# Patient Record
Sex: Female | Born: 1975 | Race: Black or African American | Hispanic: No | Marital: Married | Smoking: Never smoker
Health system: Southern US, Community
[De-identification: ages and names within clinical notes are randomized; demographics above are authoritative.]

## PROBLEM LIST (undated history)

## (undated) DIAGNOSIS — E7800 Pure hypercholesterolemia, unspecified: Secondary | ICD-10-CM

## (undated) DIAGNOSIS — K219 Gastro-esophageal reflux disease without esophagitis: Secondary | ICD-10-CM

## (undated) DIAGNOSIS — I10 Essential (primary) hypertension: Secondary | ICD-10-CM

## (undated) HISTORY — DX: Pure hypercholesterolemia, unspecified: E78.00

## (undated) HISTORY — DX: Essential (primary) hypertension: I10

## (undated) HISTORY — DX: Gastro-esophageal reflux disease without esophagitis: K21.9

---

## 2001-05-10 HISTORY — PX: OOPHORECTOMY: SHX86

## 2006-06-16 ENCOUNTER — Other Ambulatory Visit: Admission: RE | Admit: 2006-06-16 | Discharge: 2006-06-16 | Payer: Self-pay | Admitting: Obstetrics and Gynecology

## 2007-04-02 ENCOUNTER — Inpatient Hospital Stay (HOSPITAL_COMMUNITY): Admission: AD | Admit: 2007-04-02 | Discharge: 2007-04-04 | Payer: Self-pay | Admitting: Obstetrics and Gynecology

## 2007-07-25 ENCOUNTER — Other Ambulatory Visit: Admission: RE | Admit: 2007-07-25 | Discharge: 2007-07-25 | Payer: Self-pay | Admitting: Obstetrics and Gynecology

## 2008-10-08 ENCOUNTER — Other Ambulatory Visit: Admission: RE | Admit: 2008-10-08 | Discharge: 2008-10-08 | Payer: Self-pay | Admitting: Obstetrics and Gynecology

## 2009-04-07 ENCOUNTER — Emergency Department (HOSPITAL_COMMUNITY): Admission: EM | Admit: 2009-04-07 | Discharge: 2009-04-07 | Payer: Self-pay | Admitting: Emergency Medicine

## 2010-01-28 LAB — HM PAP SMEAR: HM Pap smear: NORMAL

## 2010-02-04 ENCOUNTER — Other Ambulatory Visit: Admission: RE | Admit: 2010-02-04 | Discharge: 2010-02-04 | Payer: Self-pay | Admitting: Family Medicine

## 2010-02-04 ENCOUNTER — Ambulatory Visit: Payer: Self-pay | Admitting: Physician Assistant

## 2010-02-16 ENCOUNTER — Ambulatory Visit: Payer: Self-pay | Admitting: Family Medicine

## 2010-06-05 ENCOUNTER — Ambulatory Visit
Admission: RE | Admit: 2010-06-05 | Discharge: 2010-06-05 | Payer: Self-pay | Source: Home / Self Care | Attending: Family Medicine | Admitting: Family Medicine

## 2010-12-28 ENCOUNTER — Encounter: Payer: Self-pay | Admitting: Family Medicine

## 2010-12-28 ENCOUNTER — Ambulatory Visit (INDEPENDENT_AMBULATORY_CARE_PROVIDER_SITE_OTHER): Payer: Commercial Managed Care - PPO | Admitting: Family Medicine

## 2010-12-28 VITALS — BP 132/84 | HR 72 | Ht 68.0 in | Wt 183.0 lb

## 2010-12-28 DIAGNOSIS — N926 Irregular menstruation, unspecified: Secondary | ICD-10-CM

## 2010-12-28 NOTE — Patient Instructions (Signed)
Continue to keep track of your menstrual cycles on a calendar.  If labs are normal, we will discuss other options at your physical.  If bleeding changes, worsens, or any other concerns, please do not wait until your visit in October

## 2010-12-28 NOTE — Progress Notes (Signed)
Patient presents to discuss menstrual irregularities.  Getting her period every 9-15 days since April.  Most recently has been 12-15 days--this is counting from the last day of her period, to the first day of the next period (so cycles are actually 12-20 days). Periods last 3-5 days, like a normal cycle.  Feels like a typical period--irritability, more emotional, increased eating/hunger.  Weight has been up and down.  Denies any significant fatigue--just the expected tiredness from working and having 2 children.  Denies bowel changes, skin changes, hair loss, mood changes.  Denies any significant stress, nothing new/different over the last few months.  Contraception-- husband had vasectomy.  Last CPE/pap was in October 2011--unfortunately, her chart has been misplaced  Past Medical History  Diagnosis Date  . Pure hypercholesterolemia   . Asthma     childhood    Past Surgical History  Procedure Date  . Oophorectomy 2003    for ovarian cysts--LEFT    History   Social History  . Marital Status: Married    Spouse Name: N/A    Number of Children: N/A  . Years of Education: N/A   Occupational History  . Not on file.   Social History Main Topics  . Smoking status: Never Smoker   . Smokeless tobacco: Never Used  . Alcohol Use: Yes     2 drinks per year.  . Drug Use: No  . Sexually Active: Yes -- Female partner(s)    Birth Control/ Protection: Other-see comments     husband with vasectomy   Other Topics Concern  . Not on file   Social History Narrative  . No narrative on file    Family History  Problem Relation Age of Onset  . Heart disease Mother 13    CABG at 58  . Hyperlipidemia Mother   . Hypertension Mother   . Diabetes Mother   . Arthritis Father   . Asthma Son   . Cancer Neg Hx   . Asthma Son     No current outpatient prescriptions on file.  No Known Allergies  ROS:  See above.  Denies fever, URI symptoms, chest pain, urinary complaint (mild stress  incontinence, occasional), or other concerns  PHYSICAL EXAM: BP 132/84  Pulse 72  Ht 5\' 8"  (1.727 m)  Wt 183 lb (83.008 kg)  BMI 27.82 kg/m2  LMP 12/16/2010 Pleasant, well developed female, in no distress HEENT: PERRL, EOMI, conjunctiva clear.  Whites of eyes visible below iris, but no proptosis Neck: no lymphadenopathy, thyromegaly or mass Heart: regular rate and rhythm, no murmurs Lungs: clear bilaterally Abdomen: soft, nontender, nondistended.  No organomegaly or mass Extremities: no edema Skin: no rash Psych: normal mood, affect, hygiene and grooming  ASSESSMENT/PLAN: 1. Irregular menses  CBC with Differential, TSH   If labs are normal, will continue to keep track of cycles on calendar, and bring to discuss again at her CPE/pap in October.  If starts having changes to her bleeding (ie midcycle spotting, bleeding different than typical period), then may need further evaluation sooner.

## 2010-12-29 ENCOUNTER — Encounter: Payer: Self-pay | Admitting: Family Medicine

## 2010-12-29 LAB — CBC WITH DIFFERENTIAL/PLATELET
Eosinophils Absolute: 0 10*3/uL (ref 0.0–0.7)
Eosinophils Relative: 1 % (ref 0–5)
HCT: 38.9 % (ref 36.0–46.0)
Lymphocytes Relative: 41 % (ref 12–46)
Lymphs Abs: 2 10*3/uL (ref 0.7–4.0)
MCH: 27.7 pg (ref 26.0–34.0)
MCV: 86.3 fL (ref 78.0–100.0)
Monocytes Absolute: 0.4 10*3/uL (ref 0.1–1.0)
RBC: 4.51 MIL/uL (ref 3.87–5.11)
WBC: 4.8 10*3/uL (ref 4.0–10.5)

## 2011-02-08 ENCOUNTER — Other Ambulatory Visit (HOSPITAL_COMMUNITY)
Admission: RE | Admit: 2011-02-08 | Discharge: 2011-02-08 | Disposition: A | Discharge status: Home / Self Care | Payer: Commercial Managed Care - PPO | Source: Ambulatory Visit | Attending: Family Medicine | Admitting: Family Medicine

## 2011-02-08 ENCOUNTER — Ambulatory Visit (INDEPENDENT_AMBULATORY_CARE_PROVIDER_SITE_OTHER): Payer: Commercial Managed Care - PPO | Admitting: Family Medicine

## 2011-02-08 ENCOUNTER — Encounter: Payer: Self-pay | Admitting: Family Medicine

## 2011-02-08 VITALS — BP 112/70 | HR 84 | Ht 68.0 in | Wt 187.0 lb

## 2011-02-08 DIAGNOSIS — Z23 Encounter for immunization: Secondary | ICD-10-CM

## 2011-02-08 DIAGNOSIS — R350 Frequency of micturition: Secondary | ICD-10-CM

## 2011-02-08 DIAGNOSIS — Z20828 Contact with and (suspected) exposure to other viral communicable diseases: Secondary | ICD-10-CM

## 2011-02-08 DIAGNOSIS — Z206 Contact with and (suspected) exposure to human immunodeficiency virus [HIV]: Secondary | ICD-10-CM | POA: Insufficient documentation

## 2011-02-08 DIAGNOSIS — Z0000 Encounter for general adult medical examination without abnormal findings: Secondary | ICD-10-CM

## 2011-02-08 DIAGNOSIS — Z01419 Encounter for gynecological examination (general) (routine) without abnormal findings: Secondary | ICD-10-CM | POA: Insufficient documentation

## 2011-02-08 DIAGNOSIS — E7800 Pure hypercholesterolemia, unspecified: Secondary | ICD-10-CM

## 2011-02-08 LAB — POCT URINALYSIS DIPSTICK
Bilirubin, UA: NEGATIVE
Glucose, UA: NEGATIVE
Ketones, UA: NEGATIVE
Spec Grav, UA: 1.015

## 2011-02-08 LAB — GLUCOSE, RANDOM: Glucose, Bld: 73 mg/dL (ref 70–99)

## 2011-02-08 LAB — LIPID PANEL
Cholesterol: 244 mg/dL — ABNORMAL HIGH (ref 0–200)
HDL: 53 mg/dL (ref >39)
Total CHOL/HDL Ratio: 4.6 Ratio
Triglycerides: 56 mg/dL (ref <150)

## 2011-02-08 LAB — HIV ANTIBODY (ROUTINE TESTING W REFLEX): HIV: NONREACTIVE

## 2011-02-08 NOTE — Progress Notes (Signed)
Urine culture ordered

## 2011-02-08 NOTE — Patient Instructions (Addendum)
HEALTH MAINTENANCE RECOMMENDATIONS:  It is recommended that you get at least 30 minutes of aerobic exercise at least 5 days/week (for weight loss, you may need as much as 60-90 minutes). This can be any activity that gets your heart rate up. This can be divided in 10-15 minute intervals if needed, but try and build up your endurance at least once a week.  Weight bearing exercise is also recommended twice weekly.  Eat a healthy diet with lots of vegetables, fruits and fiber.  "Colorful" foods have a lot of vitamins (ie green vegetables, tomatoes, red peppers, etc).  Limit sweet tea, regular sodas and alcoholic beverages, all of which has a lot of calories and sugar.  Up to 1 alcoholic drink daily may be beneficial for women (unless trying to lose weight, watch sugars).  Drink a lot of water.  Calcium recommendations are 1200-1500 mg daily (1500 mg for postmenopausal women or women without ovaries), and vitamin D 1000 IU daily.  This should be obtained from diet and/or supplements (vitamins), and calcium should not be taken all at once, but in divided doses.  Monthly self breast exams and yearly mammograms for women over the age of 22 is recommended.  Sunscreen of at least SPF 30 should be used on all sun-exposed parts of the skin when outside between the hours of 10 am and 4 pm (not just when at beach or pool, but even with exercise, golf, tennis, and yard work!)  Use a sunscreen that says "broad spectrum" so it covers both UVA and UVB rays, and make sure to reapply every 1-2 hours.  Remember to change the batteries in your smoke detectors when changing your clock times in the spring and fall.  Use your seat belt every time you are in a car, and please drive safely and not be distracted with cell phones and texting while driving.  Urge urinary incontinence--behavioral techniques reviewed--cut back on caffeine, more frequent voiding, not waiting until bladder is full.  Can consider medications in  future if these behavioral techniques are not successful.   Low-Fat, Low-Saturated-Fat, Low-Cholesterol Diets Food Selection Guide BREADS, CEREALS, PASTA, RICE, DRIED PEAS, AND BEANS These products are high in carbohydrates and most are low in fat. Therefore, they can be increased in the diet as substitutes for fatty foods. They too, however, contain calories and should not be eaten in excess. Cereals can be eaten for snacks as well as for breakfast.  Include foods that contain fiber (fruits, vegetables, whole grains, and legumes). Research shows that fiber may lower blood cholesterol levels, especially the water-soluble fiber found in fruits, vegetables, oat products, and legumes. FRUITS AND VEGETABLES It is good to eat fruits and vegetables. Besides being sources of fiber, both are rich in vitamins and some minerals. They help you get the daily allowances of these nutrients. Fruits and vegetables can be used for snacks and desserts. MEATS Limit lean meat, chicken, [place], and fish to no more than 6 ounces per day. Beef, Pork, and Lamb  Use lean cuts of beef, pork, and lamb. Lean cuts include:   Extra-lean ground beef.   Arm roast.   Sirloin tip.   Center-cut ham.   Round steak.   Loin chops.   Rump roast.   Tenderloin.  Trim all fat off the outside of meats before cooking. It is not necessary to severely decrease the intake of red meat, but lean choices should be made. Lean meat is rich in protein and contains a highly absorbable  form of iron. Premenopausal women, in particular, should avoid reducing lean red meat because this could increase the risk for low red blood cells (iron-deficiency anemia). Processed Meats Processed meats, such as bacon, bologna, salami, sausage, and hot dogs contain large quantities of fat, are not rich in valuable nutrients, and should not be eaten very often. Organ Meats The organ meats, such as liver, sweetbreads, kidneys, and brain are very rich in  cholesterol. They should be limited. Chicken and [place] These are good sources of protein. The fat of poultry can be reduced by removing the skin and underlying fat layers before cooking. Chicken and [place] can be substituted for lean red meat in the diet. Poultry should not be fried or covered with high-fat sauces. Fish and Shellfish Fish is a good source of protein. Shellfish contain cholesterol, but they usually are low in saturated fatty acids. The preparation of fish is important. Like chicken and [place], they should not be fried or covered with high-fat sauces. EGGS Egg yolks often are hidden in cooked and processed foods. Egg whites contain no fat or cholesterol. They can be eaten often. Try 1 to 2 egg whites instead of whole eggs in recipes or use egg substitutes that do not contain yolk. MILK AND DAIRY PRODUCTS Use skim or 1% milk instead of 2% or whole milk. Decrease whole milk, natural, and processed cheeses. Use nonfat or low-fat (2%) cottage cheese or low-fat cheeses made from vegetable oils. Choose nonfat or low-fat (1 to 2%) yogurt. Experiment with evaporated skim milk in recipes that call for heavy cream. Substitute low-fat yogurt or low-fat cottage cheese for sour cream in dips and salad dressings. Have at least 2 servings of low-fat dairy products, such as 2 glasses of skim (or 1%) milk each day to help get your daily calcium intake. FATS AND OILS Reduce the total intake of fats, especially saturated fat. Butterfat, lard, and beef fats are high in saturated fat and cholesterol. These should be avoided as much as possible. Vegetable fats do not contain cholesterol, but certain vegetable fats, such as coconut oil, palm oil, and palm kernel oil are very high in saturated fats. These should be limited. These fats are often used in bakery goods, processed foods, popcorn, oils, and nondairy creamers. Vegetable shortenings and some peanut butters contain hydrogenated oils, which are also  saturated fats. Read the labels on these foods and check for saturated vegetable oils. Unsaturated vegetable oils and fats do not raise blood cholesterol. However, they should be limited because they are fats and are high in calories. Total fat should still be limited to 30% of your daily caloric intake. Desirable liquid vegetable oils are corn oil, cottonseed oil, olive oil, canola oil, safflower oil, soybean oil, and sunflower oil. Peanut oil is not as good, but small amounts are acceptable. Buy a heart-healthy tub margarine that has no partially hydrogenated oils in the ingredients. Mayonnaise and salad dressings often are made from unsaturated fats, but they should also be limited because of their high calorie and fat content. Seeds, nuts, peanut butter, olives, and avocados are high in fat, but the fat is mainly the unsaturated type. These foods should be limited mainly to avoid excess calories and fat. OTHER EATING TIPS Snacks  Most sweets should be limited as snacks. They tend to be rich in calories and fats, and their caloric content outweighs their nutritional value. Some good choices in snacks are graham crackers, melba toast, soda crackers, bagels (no egg), English muffins, fruits,  and vegetables. These snacks are preferable to snack crackers, [place] fries, and chips. Popcorn should be air-popped or cooked in small amounts of liquid vegetable oil. Desserts Eat fruit, low-fat yogurt, and fruit ices instead of pastries, cake, and cookies. Sherbet, angel food cake, gelatin dessert, frozen low-fat yogurt, or other frozen products that do not contain saturated fat (pure fruit juice bars, frozen ice pops) are also acceptable.  COOKING METHODS Choose those methods that use little or no fat. They include:  Poaching.   Braising.   Steaming.   Grilling.   Baking.   Stir-frying.   Broiling.   Microwaving.  Foods can be cooked in a nonstick pan without added fat, or use a nonfat cooking  spray in regular cookware. Limit fried foods and avoid frying in saturated fat. Add moisture to lean meats by using water, broth, cooking wines, and other nonfat or low-fat sauces along with the cooking methods mentioned above. Soups and stews should be chilled after cooking. The fat that forms on top after a few hours in the refrigerator should be skimmed off. When preparing meals, avoid using excess salt. Salt can contribute to raising blood pressure in some people. EATING AWAY FROM HOME Order entres, potatoes, and vegetables without sauces or butter. When meat exceeds the size of a deck of cards (3 to 4 ounces), the rest can be taken home for another meal. Choose vegetable or fruit salads and ask for low-calorie salad dressings to be served on the side. Use dressings sparingly. Limit high-fat toppings, such as bacon, crumbled eggs, cheese, sunflower seeds, and olives. Ask for heart-healthy tub margarine instead of butter. Document Released: 10/16/2001 Document Re-Released: 07/21/2009 [facility] Patient Information 2011 [place], [place].

## 2011-02-08 NOTE — Progress Notes (Signed)
Chelsea Gould is a 35 y.o. female who presents for a complete physical.  She has the following concerns: She is here to follow up on her frequent periods.  Since August, periods have been every 21-24 days.  LMP 9/24, and the one prior was 8/30.  F/u hyperlipidemia, requesting bloodwork.  She cut back on fried food, using more olive oil.  Still eating red meats frequently (at least 3 days/week), 2-4 eggs/week, +cheese.  She lost about 30 pounds in the last year.  She would like cholesterol and HIV test.  Sometimes is having a harder time holding her urine, having small amount of leakage.  No leakage with coughing and sneezing. Denies dysuria, hematuria, flank pain, fevers, pelvic pain  There is no immunization history on file for this patient. She cannot recall last tetanus shot (and old chart appears to be lost), believes it was likely more than 5 years ago, or more Last Pap smear: 01/2010 Last mammogram: never Last colonoscopy: never Last DEXA: never Ophtho: 2 years Dentist: yearly Exercise: 30 minutes daily, usually core-work; not getting much cardio  Past Medical History  Diagnosis Date  . Pure hypercholesterolemia   . Asthma     childhood    Past Surgical History  Procedure Date  . Oophorectomy 2003    for ovarian cysts--LEFT    History   Social History  . Marital Status: Married    Spouse Name: N/A    Number of Children: 2  . Years of Education: N/A   Occupational History  . OFFICE MANAGER    Social History Main Topics  . Smoking status: Never Smoker   . Smokeless tobacco: Never Used  . Alcohol Use: Yes     2 drinks per year.  . Drug Use: No  . Sexually Active: Yes -- Female partner(s)    Birth Control/ Protection: Other-see comments     husband with vasectomy; also uses condoms. Husband with HIV   Other Topics Concern  . Not on file   Social History Narrative   Lives with husband, 2 sons.  Husband has HIV, and doing well on meds    Family History    Problem Relation Age of Onset  . Heart disease Mother 73    CABG at 23  . Hyperlipidemia Mother   . Hypertension Mother   . Diabetes Mother   . Arthritis Father   . Asthma Son   . Cancer Neg Hx   . Asthma Son    No current outpatient prescriptions on file.  No Known Allergies  ROS: The patient denies anorexia, fever, weight changes, headaches,  vision changes, decreased hearing, ear pain, sore throat, breast concerns, chest pain, palpitations, dizziness, syncope, dyspnea on exertion, cough, swelling, nausea, vomiting, diarrhea, constipation, abdominal pain, melena, hematochezia, indigestion/heartburn, hematuria, vaginal discharge, odor or itch, genital lesions, joint pains, numbness, tingling, weakness, tremor, suspicious skin lesions, depression, anxiety, abnormal bleeding/bruising, or enlarged lymph nodes.  +L>R breast tenderness since her last period.  Has recently bought a much larger coffee cup (drinking more caffeine)  PHYSICAL EXAM: BP 112/70  Pulse 84  Ht 5\' 8"  (1.727 m)  Wt 187 lb (84.823 kg)  BMI 28.43 kg/m2  LMP 02/01/2011  General Appearance:    Alert, cooperative, no distress, appears stated age  Head:    Normocephalic, without obvious abnormality, atraumatic  Eyes:    PERRL, conjunctiva/corneas clear, EOM's intact, fundi    benign  Ears:    Normal TM's and external ear canals  Nose:   Nares normal, mucosa normal, no drainage or sinus   tenderness  Throat:   Lips, mucosa, and tongue normal; teeth and gums normal  Neck:   Supple, no lymphadenopathy;  thyroid:  no   enlargement/tenderness/nodules; no carotid   bruit or JVD  Back:    Spine nontender, no curvature, ROM normal, no CVA     tenderness  Lungs:     Clear to auscultation bilaterally without wheezes, rales or     ronchi; respirations unlabored  Chest Wall:    No tenderness or deformity   Heart:    Regular rate and rhythm, S1 and S2 normal, no murmur, rub   or gallop  Breast Exam:    No tenderness, masses,  or nipple discharge or inversion.      No axillary lymphadenopathy. Mild tenderness L UOQ with some fibrocystic changes  Abdomen:     Soft, non-tender, nondistended, normoactive bowel sounds,    no masses, no hepatosplenomegaly  Genitalia:    Normal external genitalia without lesions.  BUS and vagina normal; cervix without lesions, or cervical motion tenderness. Many nabothian cysts present. No abnormal vaginal discharge.  Uterus and adnexa not enlarged, nontender, no masses.  Pap performed  Rectal:    Not performed due to age<40 and no related complaints  Extremities:   No clubbing, cyanosis or edema  Pulses:   2+ and symmetric all extremities  Skin:   Skin color, texture, turgor normal, no rashes or lesions  Lymph nodes:   Cervical, supraclavicular, and axillary nodes normal  Neurologic:   CNII-XII intact, normal strength, sensation and gait; reflexes 2+ and symmetric in upper extremities, 1+ and symmetric in lower extremities          Psych:   Normal mood, affect, hygiene and grooming.     ASSESSMENT/PLAN:  1. Routine general medical examination at a health care facility  POCT Urinalysis Dipstick, Visual acuity screening, Glucose, random, Cytology - PAP  2. Pure hypercholesterolemia  Lipid panel  3. Exposure to HIV  HIV Antibody ( Reflex)   husband with HIV  4. Need for Tdap vaccination  Tdap vaccine greater than or equal to 7yo IM  5. Need for influenza vaccination  Flu vaccine greater than or equal to 3yo preservative free IM  6. Urinary frequency  Urine Culture   Frequent periods--stable, without anemia. Continue to keep track of.  Declines hormonal manipulation to regulate cycles.  Urge urinary incontinence--behavioral techniques reviewed--cut back on caffeine, more frequent voiding, not waiting until bladder is full.  Can consider medications in future if these behavioral techniques are not successful.  Briefly reviewed Kegel's exercises also.  Urine sent for culture.  Low  cholesterol diet was reviewed in detail, and handout given.  Discussed monthly self breast exams and yearly mammograms after the age of 31; at least 30 minutes of aerobic activity at least 5 days/week; proper sunscreen use reviewed; healthy diet, including goals of calcium and vitamin D intake and alcohol recommendations (less than or equal to 1 drink/day) reviewed; regular seatbelt use; changing batteries in smoke detectors.  Immunization recommendations discussed--flu and TdaP given today.  Colonoscopy recommendations reviewed--age 45, sooner prn

## 2011-02-09 ENCOUNTER — Encounter: Payer: Self-pay | Admitting: Family Medicine

## 2011-02-10 ENCOUNTER — Other Ambulatory Visit: Payer: Self-pay | Admitting: *Deleted

## 2011-02-10 DIAGNOSIS — E7800 Pure hypercholesterolemia, unspecified: Secondary | ICD-10-CM

## 2011-02-10 LAB — URINE CULTURE: Colony Count: 6000

## 2011-02-16 LAB — CBC
Hemoglobin: 12.6
MCHC: 32.9
MCV: 88.5
Platelets: 215
RDW: 13.9
RDW: 13.9
WBC: 7.8

## 2011-02-16 LAB — RUBELLA SCREEN: Rubella: 15.5 — ABNORMAL HIGH

## 2011-08-02 ENCOUNTER — Other Ambulatory Visit: Payer: Commercial Managed Care - PPO

## 2011-08-11 ENCOUNTER — Encounter: Payer: Self-pay | Admitting: Medical

## 2011-08-11 ENCOUNTER — Ambulatory Visit (INDEPENDENT_AMBULATORY_CARE_PROVIDER_SITE_OTHER): Payer: Commercial Managed Care - PPO | Admitting: Medical

## 2011-08-11 ENCOUNTER — Ambulatory Visit: Payer: Commercial Managed Care - PPO | Admitting: Family Medicine

## 2011-08-11 VITALS — BP 122/80 | HR 80 | Temp 98.3°F | Resp 16 | Wt 195.0 lb

## 2011-08-11 DIAGNOSIS — R300 Dysuria: Secondary | ICD-10-CM

## 2011-08-11 DIAGNOSIS — N898 Other specified noninflammatory disorders of vagina: Secondary | ICD-10-CM

## 2011-08-11 LAB — POCT URINALYSIS DIPSTICK
Nitrite, UA: NEGATIVE
pH, UA: 8

## 2011-08-11 MED ORDER — FLUCONAZOLE 150 MG PO TABS: ORAL_TABLET | ORAL | Status: DC

## 2011-08-11 NOTE — Progress Notes (Signed)
Subjective:  Chelsea Gould is a 36 y.o. female who c/o several day hx/o burning with urination, mild whitish discharge, vaginal odor.  She denies urinary frequency or urgency, no fever, no belly or back pain, no new partners.   She has 1 partner, monogamous and he has HIV, but they use condoms the majority of the time.  She does note using some soap recently that really irritated her vaginal area.    Past Medical History  Diagnosis Date  . Pure hypercholesterolemia   . Asthma     childhood   ROS as noted in HPI  Objective:   Filed Vitals:   08/11/11 1108  BP: 122/80  Pulse: 80  Temp: 98.3 F (36.8 C)  Resp: 16    General appearance: alert, no distress, WD/WN, female Abdomen: +bs, soft, non tender, non distended, no masses, no hepatomegaly, no splenomegaly, no bruits Back: no CVA tenderness Gyn: mild whitish discharge at vaginal opening, no other lesions, otherwise normal external genitalia, cervix with mild erythema, white creamy discharge, swabs taken, no adnexal mass or tenderness, exam chaperoned by nurse      Assessment and Plan:   Encounter Diagnoses  Name Primary?  . Burning with urination Yes  . Vaginal Discharge    Wet prep and KOH prep with yeast, no obvious clue cells, scattered white cell, no trichomonads.  Will send swabs for GC/Chlamydia, urine culture.  Will treat for yeast vaginitis with Diflucan.

## 2011-08-12 LAB — GC/CHLAMYDIA PROBE AMP, GENITAL
Chlamydia, DNA Probe: NEGATIVE
GC Probe Amp, Genital: NEGATIVE

## 2011-10-29 ENCOUNTER — Other Ambulatory Visit: Payer: Self-pay | Admitting: Medical

## 2011-10-29 ENCOUNTER — Ambulatory Visit
Admission: RE | Admit: 2011-10-29 | Discharge: 2011-10-29 | Disposition: A | Discharge status: Home / Self Care | Payer: Commercial Managed Care - PPO | Source: Ambulatory Visit | Attending: Medical | Admitting: Medical

## 2011-10-29 ENCOUNTER — Ambulatory Visit (INDEPENDENT_AMBULATORY_CARE_PROVIDER_SITE_OTHER): Payer: Commercial Managed Care - PPO | Admitting: Medical

## 2011-10-29 ENCOUNTER — Encounter: Payer: Self-pay | Admitting: Medical

## 2011-10-29 VITALS — BP 120/88 | HR 72 | Temp 98.4°F | Resp 16 | Wt 198.0 lb

## 2011-10-29 DIAGNOSIS — R9389 Abnormal findings on diagnostic imaging of other specified body structures: Secondary | ICD-10-CM

## 2011-10-29 DIAGNOSIS — I8390 Asymptomatic varicose veins of unspecified lower extremity: Secondary | ICD-10-CM

## 2011-10-29 NOTE — Progress Notes (Signed)
Subjective:   HPI  Chelsea Gould is a 36 y.o. female who presents with leg c/o.  She was traveling last week, and when she went through the xray machine, the TSA official noticed a spot on her leg.  On the way back through different airport, a different TSA official noticed the same spot on xray.  They didn't tell her what they saw other than a strange xray finding. She is asymptomatic.  No other aggravating or relieving factors.    No other c/o.  The following portions of the patient's history were reviewed and updated as appropriate: allergies, current medications, past family history, past medical history, past social history, past surgical history and problem list.  Past Medical History  Diagnosis Date  . Pure hypercholesterolemia   . Asthma     childhood    No Known Allergies   Review of Systems ROS reviewed and was negative other than noted in HPI or above.    Objective:   Physical Exam  General appearance: alert, no distress, WD/WN Skin: no obvious rash or lesions, no warmth MSK: LE nontneder, there is a small varicosity of the right posterior posteromedial portion of the calve, otherwise no obvious abnormality, normal ROM, nontender, no bony finding. Neurovascularly intact.     Assessment and Plan :     Encounter Diagnoses  Name Primary?  . Abnormal x-ray Yes  . Varicose vein of leg    I suspect calcification within right posterior varicosity of lower leg. No other reason to suspect anything else.  She is asymptomatic and there is no other abnormality on exam.  Will send for xray at her request.  I called and spoke to Dr. Caroline More about this case, and he will be on the lookout for this xray and odd symptom/eval request.

## 2013-05-16 ENCOUNTER — Encounter: Payer: Self-pay | Admitting: Family Medicine

## 2013-05-16 ENCOUNTER — Ambulatory Visit (INDEPENDENT_AMBULATORY_CARE_PROVIDER_SITE_OTHER): Payer: BC Managed Care – PPO | Admitting: Family Medicine

## 2013-05-16 VITALS — BP 118/72 | HR 84 | Ht 68.5 in | Wt 199.0 lb

## 2013-05-16 DIAGNOSIS — L918 Other hypertrophic disorders of the skin: Secondary | ICD-10-CM

## 2013-05-16 DIAGNOSIS — L919 Hypertrophic disorder of the skin, unspecified: Secondary | ICD-10-CM

## 2013-05-16 DIAGNOSIS — L909 Atrophic disorder of skin, unspecified: Secondary | ICD-10-CM

## 2013-05-16 DIAGNOSIS — Z23 Encounter for immunization: Secondary | ICD-10-CM

## 2013-05-16 NOTE — Progress Notes (Signed)
Chief Complaint  Patient presents with  . Nevus    has had a mole right side(bra area) that has recently gotten larger and painful.     She has had this mole under her right breast for many years.  This week it became painful, and larger.  She thinks the bra is irritating it. She has been using a bandage or cotton ball to protect it, and it is currently less painful and smaller in size than it was earlier in the week.  Past Medical History  Diagnosis Date  . Pure hypercholesterolemia   . Asthma     childhood   Past Surgical History  Procedure Laterality Date  . Oophorectomy  2003    for ovarian cysts--LEFT   History   Social History  . Marital Status: Married    Spouse Name: N/A    Number of Children: 2  . Years of Education: N/A   Occupational History  . OFFICE MANAGER    Social History Main Topics  . Smoking status: Never Smoker   . Smokeless tobacco: Never Used  . Alcohol Use: Yes     Comment: 1 drink per month  . Drug Use: No  . Sexual Activity: Yes    Partners: Male    Birth Control/ Protection: Other-see comments     Comment: husband with vasectomy; also uses condoms. Husband with HIV   Other Topics Concern  . Not on file   Social History Narrative   Lives with husband, 2 sons.  Husband has HIV, and doing well on meds   No current outpatient prescriptions on file.  No Known Allergies  ROS:  No bleeding, bruising, other skin lesions.  No fevers, URI symptoms, cough, shortness of breath or wheezing.  PHYSICAL EXAM: BP 118/72  Pulse 84  Ht 5' 8.5" (1.74 m)  Wt 199 lb (90.266 kg)  BMI 29.81 kg/m2  LMP 05/07/2013 Well developed, pleasant female in no distress Under her right breast is a skin tag that is slightly inflamed, and partially ripped off. There is no surrounding erythema, warmth, no drainage  The area was cleansed with alcohol, and an 11 blade was used to remove the rest of the skin tag (less than 1/362mm of attachment left).  Pt tolerated this  minor procedure well, and there was no bleeding at all  ASSESSMENT/PLAN:  Skin tag  Need for prophylactic vaccination and inoculation against influenza - Plan: Flu Vaccine QUAD 36+ mos PF IM (Fluarix)   Flu shot today. F/u at CPE, which she just scheduled for March

## 2013-05-16 NOTE — Patient Instructions (Signed)
You had a skin tag that was inflamed and partly falling off. This was removed today.  Keep bacitracin until on the area until completely healed

## 2013-05-17 DIAGNOSIS — Z23 Encounter for immunization: Secondary | ICD-10-CM

## 2013-07-19 ENCOUNTER — Ambulatory Visit (INDEPENDENT_AMBULATORY_CARE_PROVIDER_SITE_OTHER): Payer: BC Managed Care – PPO | Admitting: Family Medicine

## 2013-07-19 ENCOUNTER — Encounter: Payer: Self-pay | Admitting: Family Medicine

## 2013-07-19 ENCOUNTER — Other Ambulatory Visit (HOSPITAL_COMMUNITY)
Admission: RE | Admit: 2013-07-19 | Discharge: 2013-07-19 | Disposition: A | Discharge status: Home / Self Care | Payer: Commercial Managed Care - PPO | Source: Ambulatory Visit | Attending: Family Medicine | Admitting: Family Medicine

## 2013-07-19 VITALS — BP 122/72 | HR 72 | Ht 68.0 in | Wt 201.0 lb

## 2013-07-19 DIAGNOSIS — Z0000 Encounter for general adult medical examination without abnormal findings: Secondary | ICD-10-CM

## 2013-07-19 DIAGNOSIS — Z1151 Encounter for screening for human papillomavirus (HPV): Secondary | ICD-10-CM | POA: Insufficient documentation

## 2013-07-19 DIAGNOSIS — R5381 Other malaise: Secondary | ICD-10-CM

## 2013-07-19 DIAGNOSIS — Z206 Contact with and (suspected) exposure to human immunodeficiency virus [HIV]: Secondary | ICD-10-CM

## 2013-07-19 DIAGNOSIS — R8290 Unspecified abnormal findings in urine: Secondary | ICD-10-CM

## 2013-07-19 DIAGNOSIS — R5383 Other fatigue: Secondary | ICD-10-CM

## 2013-07-19 DIAGNOSIS — Z20828 Contact with and (suspected) exposure to other viral communicable diseases: Secondary | ICD-10-CM

## 2013-07-19 DIAGNOSIS — Z01419 Encounter for gynecological examination (general) (routine) without abnormal findings: Secondary | ICD-10-CM | POA: Insufficient documentation

## 2013-07-19 DIAGNOSIS — R82998 Other abnormal findings in urine: Secondary | ICD-10-CM

## 2013-07-19 DIAGNOSIS — N926 Irregular menstruation, unspecified: Secondary | ICD-10-CM

## 2013-07-19 DIAGNOSIS — E7800 Pure hypercholesterolemia, unspecified: Secondary | ICD-10-CM

## 2013-07-19 LAB — CBC WITH DIFFERENTIAL/PLATELET
Basophils Absolute: 0.1 10*3/uL (ref 0.0–0.1)
Basophils Relative: 1 % (ref 0–1)
EOS ABS: 0.1 10*3/uL (ref 0.0–0.7)
EOS PCT: 1 % (ref 0–5)
HCT: 36.7 % (ref 36.0–46.0)
HEMOGLOBIN: 12.2 g/dL (ref 12.0–15.0)
LYMPHS ABS: 2.5 10*3/uL (ref 0.7–4.0)
Lymphocytes Relative: 45 % (ref 12–46)
MCH: 27.9 pg (ref 26.0–34.0)
MCHC: 33.2 g/dL (ref 30.0–36.0)
MCV: 83.8 fL (ref 78.0–100.0)
MONO ABS: 0.4 10*3/uL (ref 0.1–1.0)
MONOS PCT: 8 % (ref 3–12)
Neutro Abs: 2.5 10*3/uL (ref 1.7–7.7)
Neutrophils Relative %: 45 % (ref 43–77)
Platelets: 319 10*3/uL (ref 150–400)
RBC: 4.38 MIL/uL (ref 3.87–5.11)
RDW: 13.6 % (ref 11.5–15.5)
WBC: 5.6 10*3/uL (ref 4.0–10.5)

## 2013-07-19 LAB — POCT URINALYSIS DIPSTICK
Bilirubin, UA: NEGATIVE
Blood, UA: NEGATIVE
Glucose, UA: NEGATIVE
Nitrite, UA: NEGATIVE
PH UA: 6
Spec Grav, UA: 1.015
UROBILINOGEN UA: NEGATIVE

## 2013-07-19 LAB — LIPID PANEL
CHOL/HDL RATIO: 4.8 ratio
CHOLESTEROL: 247 mg/dL — AB (ref 0–200)
HDL: 52 mg/dL (ref >39)
LDL Cholesterol: 183 mg/dL — ABNORMAL HIGH (ref 0–99)
Triglycerides: 60 mg/dL (ref <150)
VLDL: 12 mg/dL (ref 0–40)

## 2013-07-19 LAB — COMPREHENSIVE METABOLIC PANEL
ALK PHOS: 64 U/L (ref 39–117)
ALT: 13 U/L (ref 0–35)
AST: 16 U/L (ref 0–37)
Albumin: 4.3 g/dL (ref 3.5–5.2)
BILIRUBIN TOTAL: 0.4 mg/dL (ref 0.2–1.2)
BUN: 11 mg/dL (ref 6–23)
CO2: 24 mEq/L (ref 19–32)
Calcium: 9.3 mg/dL (ref 8.4–10.5)
Chloride: 102 mEq/L (ref 96–112)
Creat: 0.8 mg/dL (ref 0.50–1.10)
GLUCOSE: 75 mg/dL (ref 70–99)
Potassium: 4 mEq/L (ref 3.5–5.3)
SODIUM: 135 meq/L (ref 135–145)
Total Protein: 7.2 g/dL (ref 6.0–8.3)

## 2013-07-19 NOTE — Progress Notes (Signed)
Chief Complaint  Patient presents with  . Annual Exam    fasting annual exam with pap. No concerns. UA showed max leuks, trace protein and 3+ ketones-no symptoms.    Chelsea Gould is a 38 y.o. female who presents for a complete physical.  She has the following concerns:  Hyperlipidemia:  She never returned for recheck of lipids. Last LDL in 2012 was 180.  She eats red meat 2-3 times/week. She snacks on cheese at work, 2-4 eggs/week (just on the weekends, with cheese)  Urge urinary incontinence:  She is no longer waiting as long to go to the bathroom, and isn't having as many problems as at last visit.  She had very slight feeling of burning last week, but not currently having any dysuria, urgency or frequency.  She felt just slightly crampy earlier today, like her cycle might be coming on.  She complains of feeling tired--she thinks she doesn't sleep enough, and has a lot on her plate at all times, +stress.  Periods are every 18-22 days, sometimes are heavy and painful, other times are light and not painful.  Immunization History  Administered Date(s) Administered  . Influenza Split 02/08/2011  . Influenza,inj,Quad PF,36+ Mos 05/17/2013  . Tdap 02/08/2011   Last Pap smear: 02/2011 (no HPV testing done) Last mammogram: never  Last colonoscopy: never  Last DEXA: never  Ophtho: last year (has glasses) Dentist: yearly, but might be behind Exercise: no exercise since June-"busy, lazy"  Past Medical History  Diagnosis Date  . Pure hypercholesterolemia   . Asthma     childhood    Past Surgical History  Procedure Laterality Date  . Oophorectomy  2003    for ovarian cysts--LEFT    History   Social History  . Marital Status: Married    Spouse Name: N/A    Number of Children: 2  . Years of Education: N/A   Occupational History  . OFFICE MANAGER    Social History Main Topics  . Smoking status: Never Smoker   . Smokeless tobacco: Never Used  . Alcohol Use: Yes   Comment: 1 drink per month  . Drug Use: No  . Sexual Activity: Yes    Partners: Male    Birth Control/ Protection: Other-see comments     Comment: husband with vasectomy; also uses condoms (but admits not 100% of the time). Husband with HIV   Other Topics Concern  . Not on file   Social History Narrative   Lives with husband, 2 sons.  Husband has HIV, and doing well on meds (diagnosed in 2011)    Family History  Problem Relation Age of Onset  . Heart disease Mother 9360    CABG at 2061  . Hyperlipidemia Mother   . Hypertension Mother   . Diabetes Mother   . Arthritis Father   . Asthma Son   . Cancer Neg Hx   . Asthma Son   . Depression Sister     No current outpatient prescriptions on file.  No Known Allergies  ROS: The patient denies anorexia, fever, headaches (just occasional), vision changes, decreased hearing, ear pain, sore throat, breast concerns, chest pain, palpitations, dizziness, syncope, dyspnea on exertion, cough, swelling, nausea, vomiting, diarrhea, constipation, abdominal pain, melena, hematochezia, indigestion/heartburn, hematuria, vaginal discharge, odor or itch, genital lesions, joint pains, numbness, tingling, weakness, tremor, suspicious skin lesions, depression, anxiety, abnormal bleeding/bruising, or enlarged lymph nodes. Occasional LBP  +14 pound weight gain since last physical in 02/2011--she relates related to lack  of exercise.  LOTS of eating and celebrations between September and January.  PHYSICAL EXAM: BP 122/72  Pulse 72  Ht 5\' 8"  (1.727 m)  Wt 201 lb (91.173 kg)  BMI 30.57 kg/m2  LMP 07/04/2013  General Appearance:  Alert, cooperative, no distress, appears stated age   Head:  Normocephalic, without obvious abnormality, atraumatic   Eyes:  PERRL, conjunctiva/corneas clear, EOM's intact, fundi  benign   Ears:  Normal TM's and external ear canals   Nose:  Nares normal, mucosa normal, no drainage or sinus tenderness   Throat:  Lips, mucosa,  and tongue normal; teeth and gums normal   Neck:  Supple, no lymphadenopathy; thyroid: no enlargement/tenderness/nodules; no carotid  bruit or JVD   Back:  Spine nontender, no curvature, ROM normal, no CVA tenderness   Lungs:  Clear to auscultation bilaterally without wheezes, rales or ronchi; respirations unlabored   Chest Wall:  No tenderness or deformity   Heart:  Regular rate and rhythm, S1 and S2 normal, no murmur, rub  or gallop   Breast Exam:  No tenderness, masses, or nipple discharge or inversion. No axillary lymphadenopathy. Mild tenderness L UOQ with some fibrocystic changes   Abdomen:  Soft, non-tender, nondistended, normoactive bowel sounds,  no masses, no hepatosplenomegaly   Genitalia:  Normal external genitalia without lesions. BUS and vagina normal; cervix without lesions, or cervical motion tenderness. Many nabothian cysts present. Moderate amount of thin white discharge. Uterus and adnexa not enlarged, nontender, no masses. Pap performed   Rectal:  Not performed due to age<40 and no related complaints   Extremities:  No clubbing, cyanosis or edema   Pulses:  2+ and symmetric all extremities   Skin:  Skin color, texture, turgor normal, no rashes or lesions   Lymph nodes:  Cervical, supraclavicular, and axillary nodes normal   Neurologic:  CNII-XII intact, normal strength, sensation and gait; reflexes 2+ and symmetric in upper extremities, 1+ and symmetric in lower extremities          Psych: Normal mood, affect, hygiene and grooming.   ASSESSMENT/PLAN:  Routine general medical examination at a health care facility - Plan: POCT Urinalysis Dipstick, Visual acuity screening, Lipid panel, Comprehensive metabolic panel, CBC with Differential, Vit D  25 hydroxy (rtn osteoporosis monitoring), TSH, HIV antibody, Cytology - PAP   Irregular menses - Plan: CBC with Differential, TSH  Exposure to HIV - condom use encouraged  - Plan: HIV antibody  Pure hypercholesterolemia  - low cholesterol diet reviewed in detail - Plan: Lipid panel  Abnormal urinalysis - minimal symptoms.  send for culture - Plan: Urine culture  Other malaise and fatigue - Plan: Comprehensive metabolic panel, CBC with Differential, Vit D  25 hydroxy (rtn osteoporosis monitoring), TSH   Discussed monthly self breast exams and yearly mammograms after the age of 20; at least 30 minutes of aerobic activity at least 5 days/week; proper sunscreen use reviewed; healthy diet, including goals of calcium and vitamin D intake and alcohol recommendations (less than or equal to 1 drink/day) reviewed; regular seatbelt use; changing batteries in smoke detectors. Immunization recommendations discussed--UTD. Colonoscopy recommendations reviewed--age 28, sooner prn

## 2013-07-19 NOTE — Patient Instructions (Signed)
HEALTH MAINTENANCE RECOMMENDATIONS:  It is recommended that you get at least 30 minutes of aerobic exercise at least 5 days/week (for weight loss, you may need as much as 60-90 minutes). This can be any activity that gets your heart rate up. This can be divided in 10-15 minute intervals if needed, but try and build up your endurance at least once a week.  Weight bearing exercise is also recommended twice weekly.  Eat a healthy diet with lots of vegetables, fruits and fiber.  "Colorful" foods have a lot of vitamins (ie green vegetables, tomatoes, red peppers, etc).  Limit sweet tea, regular sodas and alcoholic beverages, all of which has a lot of calories and sugar.  Up to 1 alcoholic drink daily may be beneficial for women (unless trying to lose weight, watch sugars).  Drink a lot of water.  Calcium recommendations are 1200-1500 mg daily (1500 mg for postmenopausal women or women without ovaries), and vitamin D 1000 IU daily.  This should be obtained from diet and/or supplements (vitamins), and calcium should not be taken all at once, but in divided doses.  Monthly self breast exams and yearly mammograms for women over the age of 66 is recommended.  Sunscreen of at least SPF 30 should be used on all sun-exposed parts of the skin when outside between the hours of 10 am and 4 pm (not just when at beach or pool, but even with exercise, golf, tennis, and yard work!)  Use a sunscreen that says "broad spectrum" so it covers both UVA and UVB rays, and make sure to reapply every 1-2 hours.  Remember to change the batteries in your smoke detectors when changing your clock times in the spring and fall.  Use your seat belt every time you are in a car, and please drive safely and not be distracted with cell phones and texting while driving.  Fat and Cholesterol Control Diet Fat and cholesterol levels in your blood and organs are influenced by your diet. High levels of fat and cholesterol may lead to diseases  of the heart, small and large blood vessels, gallbladder, liver, and pancreas. CONTROLLING FAT AND CHOLESTEROL WITH DIET Although exercise and lifestyle factors are important, your diet is key. That is because certain foods are known to raise cholesterol and others to lower it. The goal is to balance foods for their effect on cholesterol and more importantly, to replace saturated and trans fat with other types of fat, such as monounsaturated fat, polyunsaturated fat, and omega-3 fatty acids. On average, a person should consume no more than 15 to 17 g of saturated fat daily. Saturated and trans fats are considered "bad" fats, and they will raise LDL cholesterol. Saturated fats are primarily found in animal products such as meats, butter, and cream. However, that does not mean you need to give up all your favorite foods. Today, there are good tasting, low-fat, low-cholesterol substitutes for most of the things you like to eat. Choose low-fat or nonfat alternatives. Choose round or loin cuts of red meat. These types of cuts are lowest in fat and cholesterol. Chicken (without the skin), fish, veal, and ground [place] breast are great choices. Eliminate fatty meats, such as hot dogs and salami. Even shellfish have little or no saturated fat. Have a 3 oz (85 g) portion when you eat lean meat, poultry, or fish. Trans fats are also called "partially hydrogenated oils." They are oils that have been scientifically manipulated so that they are solid at room temperature  resulting in a longer shelf life and improved taste and texture of foods in which they are added. Trans fats are found in stick margarine, some tub margarines, cookies, crackers, and baked goods.  When baking and cooking, oils are a great substitute for butter. The monounsaturated oils are especially beneficial since it is believed they lower LDL and raise HDL. The oils you should avoid entirely are saturated tropical oils, such as coconut and palm.    Remember to eat a lot from food groups that are naturally free of saturated and trans fat, including fish, fruit, vegetables, beans, grains (barley, rice, couscous, bulgur wheat), and pasta (without cream sauces).  IDENTIFYING FOODS THAT LOWER FAT AND CHOLESTEROL  Soluble fiber may lower your cholesterol. This type of fiber is found in fruits such as apples, vegetables such as broccoli, potatoes, and carrots, legumes such as beans, peas, and lentils, and grains such as barley. Foods fortified with plant sterols (phytosterol) may also lower cholesterol. You should eat at least 2 g per day of these foods for a cholesterol lowering effect.  Read package labels to identify low-saturated fats, trans fat free, and low-fat foods at the supermarket. Select cheeses that have only 2 to 3 g saturated fat per ounce. Use a heart-healthy tub margarine that is free of trans fats or partially hydrogenated oil. When buying baked goods (cookies, crackers), avoid partially hydrogenated oils. Breads and muffins should be made from whole grains (whole-wheat or whole oat flour, instead of "flour" or "enriched flour"). Buy non-creamy canned soups with reduced salt and no added fats.  FOOD PREPARATION TECHNIQUES  Never deep-fry. If you must fry, either stir-fry, which uses very little fat, or use non-stick cooking sprays. When possible, broil, bake, or roast meats, and steam vegetables. Instead of putting butter or margarine on vegetables, use lemon and herbs, applesauce, and cinnamon (for squash and sweet potatoes). Use nonfat yogurt, salsa, and low-fat dressings for salads.  LOW-SATURATED FAT / LOW-FAT FOOD SUBSTITUTES Meats / Saturated Fat (g)  Avoid: Steak, marbled (3 oz/85 g) / 11 g  Choose: Steak, lean (3 oz/85 g) / 4 g  Avoid: Hamburger (3 oz/85 g) / 7 g  Choose: Hamburger, lean (3 oz/85 g) / 5 g  Avoid: Ham (3 oz/85 g) / 6 g  Choose: Ham, lean cut (3 oz/85 g) / 2.4 g  Avoid: Chicken, with skin, dark meat (3  oz/85 g) / 4 g  Choose: Chicken, skin removed, dark meat (3 oz/85 g) / 2 g  Avoid: Chicken, with skin, light meat (3 oz/85 g) / 2.5 g  Choose: Chicken, skin removed, light meat (3 oz/85 g) / 1 g Dairy / Saturated Fat (g)  Avoid: Whole milk (1 cup) / 5 g  Choose: Low-fat milk, 2% (1 cup) / 3 g  Choose: Low-fat milk, 1% (1 cup) / 1.5 g  Choose: Skim milk (1 cup) / 0.3 g  Avoid: Hard cheese (1 oz/28 g) / 6 g  Choose: Skim milk cheese (1 oz/28 g) / 2 to 3 g  Avoid: Cottage cheese, 4% fat (1 cup) / 6.5 g  Choose: Low-fat cottage cheese, 1% fat (1 cup) / 1.5 g  Avoid: Ice cream (1 cup) / 9 g  Choose: Sherbet (1 cup) / 2.5 g  Choose: Nonfat frozen yogurt (1 cup) / 0.3 g  Choose: Frozen fruit bar / trace  Avoid: Whipped cream (1 tbs) / 3.5 g  Choose: Nondairy whipped topping (1 tbs) / 1 g Condiments /  Saturated Fat (g)  Avoid: Mayonnaise (1 tbs) / 2 g  Choose: Low-fat mayonnaise (1 tbs) / 1 g  Avoid: Butter (1 tbs) / 7 g  Choose: Extra light margarine (1 tbs) / 1 g  Avoid: Coconut oil (1 tbs) / 11.8 g  Choose: Olive oil (1 tbs) / 1.8 g  Choose: Corn oil (1 tbs) / 1.7 g  Choose: Safflower oil (1 tbs) / 1.2 g  Choose: Sunflower oil (1 tbs) / 1.4 g  Choose: Soybean oil (1 tbs) / 2.4 g  Choose: Canola oil (1 tbs) / 1 g Document Released: 04/26/2005 Document Revised: 08/21/2012 Document Reviewed: 10/15/2010 [employer] Patient Information 2014 [employer] [place].

## 2013-07-20 LAB — TSH: TSH: 0.48 u[IU]/mL (ref 0.350–4.500)

## 2013-07-20 LAB — VITAMIN D 25 HYDROXY (VIT D DEFICIENCY, FRACTURES): Vit D, 25-Hydroxy: 12 ng/mL — ABNORMAL LOW (ref 30–89)

## 2013-07-20 LAB — HIV ANTIBODY (ROUTINE TESTING W REFLEX): HIV: NONREACTIVE

## 2013-07-21 LAB — URINE CULTURE

## 2013-07-23 ENCOUNTER — Other Ambulatory Visit: Payer: Self-pay | Admitting: *Deleted

## 2013-07-23 DIAGNOSIS — E559 Vitamin D deficiency, unspecified: Secondary | ICD-10-CM

## 2013-07-23 MED ORDER — ERGOCALCIFEROL 1.25 MG (50000 UT) PO CAPS: 50000.0000 [IU] | ORAL_CAPSULE | ORAL | Status: DC

## 2013-07-25 ENCOUNTER — Other Ambulatory Visit: Payer: Self-pay | Admitting: *Deleted

## 2013-07-25 DIAGNOSIS — E7800 Pure hypercholesterolemia, unspecified: Secondary | ICD-10-CM

## 2013-07-25 DIAGNOSIS — E559 Vitamin D deficiency, unspecified: Secondary | ICD-10-CM

## 2013-12-27 ENCOUNTER — Telehealth: Payer: Self-pay | Admitting: *Deleted

## 2013-12-27 ENCOUNTER — Other Ambulatory Visit: Payer: BC Managed Care – PPO

## 2013-12-27 DIAGNOSIS — R252 Cramp and spasm: Secondary | ICD-10-CM

## 2013-12-27 DIAGNOSIS — E7800 Pure hypercholesterolemia, unspecified: Secondary | ICD-10-CM

## 2013-12-27 DIAGNOSIS — E559 Vitamin D deficiency, unspecified: Secondary | ICD-10-CM

## 2013-12-27 NOTE — Telephone Encounter (Signed)
Patient was in this morning to have lipid and vitamin D rechecked. She wanted to know if you could add on a potassium level. She has had leg cramping for quite a while now that has worsened over the last month or so.

## 2013-12-27 NOTE — Telephone Encounter (Signed)
Ok for potassium, dx leg cramp

## 2013-12-28 LAB — POTASSIUM: Potassium: 4.2 mEq/L (ref 3.5–5.3)

## 2013-12-28 LAB — LIPID PANEL
CHOL/HDL RATIO: 4.9 ratio
CHOLESTEROL: 251 mg/dL — AB (ref 0–200)
HDL: 51 mg/dL (ref >39)
LDL Cholesterol: 189 mg/dL — ABNORMAL HIGH (ref 0–99)
TRIGLYCERIDES: 57 mg/dL (ref <150)
VLDL: 11 mg/dL (ref 0–40)

## 2013-12-28 LAB — VITAMIN D 25 HYDROXY (VIT D DEFICIENCY, FRACTURES): Vit D, 25-Hydroxy: 21 ng/mL — ABNORMAL LOW (ref 30–89)

## 2013-12-31 ENCOUNTER — Ambulatory Visit (INDEPENDENT_AMBULATORY_CARE_PROVIDER_SITE_OTHER): Payer: BC Managed Care – PPO | Admitting: Family Medicine

## 2013-12-31 ENCOUNTER — Encounter: Payer: Self-pay | Admitting: Family Medicine

## 2013-12-31 VITALS — BP 132/80 | HR 68 | Ht 68.0 in | Wt 188.0 lb

## 2013-12-31 DIAGNOSIS — Z23 Encounter for immunization: Secondary | ICD-10-CM

## 2013-12-31 DIAGNOSIS — E7800 Pure hypercholesterolemia, unspecified: Secondary | ICD-10-CM

## 2013-12-31 DIAGNOSIS — R252 Cramp and spasm: Secondary | ICD-10-CM | POA: Insufficient documentation

## 2013-12-31 DIAGNOSIS — E559 Vitamin D deficiency, unspecified: Secondary | ICD-10-CM | POA: Insufficient documentation

## 2013-12-31 DIAGNOSIS — Z79899 Other long term (current) drug therapy: Secondary | ICD-10-CM

## 2013-12-31 DIAGNOSIS — Z8249 Family history of ischemic heart disease and other diseases of the circulatory system: Secondary | ICD-10-CM | POA: Insufficient documentation

## 2013-12-31 MED ORDER — ROSUVASTATIN CALCIUM 10 MG PO TABS: 10.0000 mg | ORAL_TABLET | Freq: Every day | ORAL | Status: DC

## 2013-12-31 MED ORDER — ERGOCALCIFEROL 1.25 MG (50000 UT) PO CAPS: 50000.0000 [IU] | ORAL_CAPSULE | ORAL | Status: DC

## 2013-12-31 NOTE — Progress Notes (Signed)
Chief Complaint  Patient presents with  . Follow-up    lab follow up.    Vitamin D deficiency--she states pharmacy only gave her 4 capsules, and no refills were on the bottle (it was prescribed for #12). She realizes now she should have called [place] (since she was told to take it for 3 months). She took the 1 month of prescription and has not taken any OTC supplements.  Hyperlipidemia:  Since her last visit, she made significant dietary changes, and has lost 13 pounds.  She cut back on her red meat to once a week, eating more chicken and fish.  She has been eating greens in smoothies.  She uses balsamic or [place] dressing.  She has been using egg whites since her last visit.  She continues to eat cheese (didn't cut back).  +ice cream over the last 2 months.  Rare alfredo sauces.   She is complaining of her legs and toes cramping, and sometimes her hands will also cramp.  Cramping is worse when she stretches. She reports drinking a lot of water.  She had asked to have potassium added to her bloodwork, due to these cramps.  Past Medical History  Diagnosis Date  . Pure hypercholesterolemia   . Asthma     childhood   Past Surgical History  Procedure Laterality Date  . Oophorectomy  2003    for ovarian cysts--LEFT   History   Social History  . Marital Status: Married    Spouse Name: N/A    Number of Children: 2  . Years of Education: N/A   Occupational History  . OFFICE MANAGER    Social History Main Topics  . Smoking status: Never Smoker   . Smokeless tobacco: Never Used  . Alcohol Use: Yes     Comment: 1 drink per month  . Drug Use: No  . Sexual Activity: Yes    Partners: Male    Birth Control/ Protection: Other-see comments     Comment: husband with vasectomy; also uses condoms (but admits not 100% of the time). Husband with HIV   Other Topics Concern  . Not on file   Social History Narrative   Lives with husband, 2 sons.  Husband has HIV, and doing well on meds (diagnosed  in 2011)   Family History  Problem Relation Age of Onset  . Heart disease Mother 87    CABG at 8  . Hyperlipidemia Mother   . Hypertension Mother   . Diabetes Mother   . Arthritis Father   . Asthma Son   . Cancer Neg Hx   . Asthma Son   . Depression Sister    No current outpatient prescriptions on file prior to visit.   No current facility-administered medications on file prior to visit.   No Known Allergies  ROS:  +intentional weight loss.  No headaches, dizziness, fatigue. No chest pain, palpitations, shortness of breath, GI complaints. +muscle cramps periodically as per HPI. No myalgias or joint pains.  1 month ago, out of the blue on her left nipple she had a small blister.  It was very painful.  Resolved after a few days, hasn't recurred.  PHYSICAL EXAM: BP 132/80  Pulse 68  Ht  (1.727 m)  Wt 188 lb (85.276 kg)  BMI 28.59 kg/m2  LMP 12/16/2013  Well developed, pleasant female in no distress. Remainder of visit was limited to review of labs and counseling.   Lab Results  Component Value Date   CHOL  251* 12/27/2013   HDL 51 12/27/2013   LDLCALC 189* 12/27/2013   TRIG 57 12/27/2013   CHOLHDL 4.9 12/27/2013   Vitamin D-OH 21 (up from 12) K+ = 4.2  ASSESSMENT/PLAN:  Pure hypercholesterolemia - no improvement despite significant dietary changes.  Likely has hereditary component.  Start Crestor. Risks reviewed. - Plan: rosuvastatin (CRESTOR) 10 MG tablet  Unspecified vitamin D deficiency - 12 weeks rx, followed by longterm OTC supplementation - Plan: ergocalciferol (VITAMIN D2) 50000 UNITS capsule  Cramp of both lower extremities - counseled re: possible causes, treatments  Family history of heart disease in female family member before age 91   Vit D deficiency--rx x 12 months; discussed importance of OTC afterwards.  Recheck at CPE in 6 months  Hyperlipidemia: Crestor  #2 weeks of samples were given, and rx for #90 with $3 copay card.  Coenzyme Q10 if  needed for side effects.  Call if not tolerating med  Risks/side effects were reviewed.  Husband had vasectomy.  Flu shot given  CPE 6 months 2-3 months LFT's and lipids (lab visit only)  (Vit D to be rechecked at CPE)

## 2013-12-31 NOTE — Patient Instructions (Addendum)
Leg Cramps Leg cramps that occur during exercise can be caused by poor circulation or dehydration. However, muscle cramps that occur at rest or during the night are usually not due to any serious medical problem. Heat cramps may cause muscle spasms during hot weather.  CAUSES There is no clear cause for muscle cramps. However, dehydration may be a factor for those who do not drink enough fluids and those who exercise in the heat. Imbalances in the level of sodium, potassium, calcium or magnesium in the muscle tissue may also be a factor. Some medications, such as water pills (diuretics), may cause loss of chemicals that the body needs (like sodium and potassium) and cause muscle cramps. TREATMENT   Make sure your diet has enough fluids and essential minerals for the muscle to work normally.  Avoid strenuous exercise for several days if you have been having frequent leg cramps.  Stretch and massage the cramped muscle for several minutes.  Some medicines may be helpful in some patients with night cramps. Only take over-the-counter or prescription medicines as directed by your caregiver. SEEK IMMEDIATE MEDICAL CARE IF:   Your leg cramps become worse.  Your foot becomes cold, numb, or blue. Document Released: 06/03/2004 Document Revised: 07/19/2011 Document Reviewed: 05/21/2008 [facility] Patient Information 2015 [place], [place]. This information is not intended to replace advice given to you by your health care provider. Make sure you discuss any questions you have with your health care provider.   Look into the [facility] article on using the white bar of soap for leg cramps. You can also consider drinking some tonic water at bedtime (has quinine).  After you complete the 12 weeks of prescription vitamin D, please make sure to get an over-the-counter supplement that you will need to take long-term.  I recommend at least 1000 (up to 2000) IU every day.  This can be in a separate vitamin D,  vs in a multi-vitamin (usually the "women's" multivitamin has at least [phone] IU of vitamin D--please look at the label.  Continue low cholesterol diet.  Start the Crestor once daily.  If you develop any muscle aches, start taking Coenzyme Q10 along with it.  If the muscle pain is severe, if there is any associated weakness, please contact us.

## 2014-03-05 ENCOUNTER — Other Ambulatory Visit: Payer: BC Managed Care – PPO

## 2014-03-05 DIAGNOSIS — Z79899 Other long term (current) drug therapy: Secondary | ICD-10-CM

## 2014-03-05 DIAGNOSIS — E7800 Pure hypercholesterolemia, unspecified: Secondary | ICD-10-CM

## 2014-03-05 LAB — HEPATIC FUNCTION PANEL
ALT: 12 U/L (ref 0–35)
AST: 15 U/L (ref 0–37)
Albumin: 4.1 g/dL (ref 3.5–5.2)
Alkaline Phosphatase: 58 U/L (ref 39–117)
BILIRUBIN DIRECT: 0.1 mg/dL (ref 0.0–0.3)
BILIRUBIN TOTAL: 0.4 mg/dL (ref 0.2–1.2)
Indirect Bilirubin: 0.3 mg/dL (ref 0.2–1.2)
Total Protein: 7 g/dL (ref 6.0–8.3)

## 2014-03-05 LAB — LIPID PANEL
CHOL/HDL RATIO: 3 ratio
CHOLESTEROL: 152 mg/dL (ref 0–200)
HDL: 51 mg/dL (ref >39)
LDL Cholesterol: 90 mg/dL (ref 0–99)
Triglycerides: 56 mg/dL (ref <150)
VLDL: 11 mg/dL (ref 0–40)

## 2014-03-11 ENCOUNTER — Encounter: Payer: Self-pay | Admitting: Family Medicine

## 2014-05-14 ENCOUNTER — Encounter: Payer: Self-pay | Admitting: Family Medicine

## 2014-05-14 DIAGNOSIS — E7800 Pure hypercholesterolemia, unspecified: Secondary | ICD-10-CM

## 2014-05-14 MED ORDER — ROSUVASTATIN CALCIUM 10 MG PO TABS: 10.0000 mg | ORAL_TABLET | Freq: Every day | ORAL | Status: DC

## 2014-05-14 NOTE — Telephone Encounter (Signed)
Refill sent.  Has CPE in March.  Needs OV sooner to discuss irregular cycles and birth control pills (pt wrote MyChart message requesting appt to be scheduled after 1/8)

## 2014-05-20 ENCOUNTER — Encounter: Payer: Self-pay | Admitting: Family Medicine

## 2014-05-20 ENCOUNTER — Ambulatory Visit (INDEPENDENT_AMBULATORY_CARE_PROVIDER_SITE_OTHER): Payer: BLUE CROSS/BLUE SHIELD | Admitting: Family Medicine

## 2014-05-20 VITALS — BP 140/94 | HR 76 | Ht 68.0 in | Wt 201.0 lb

## 2014-05-20 DIAGNOSIS — N926 Irregular menstruation, unspecified: Secondary | ICD-10-CM

## 2014-05-20 DIAGNOSIS — Z206 Contact with and (suspected) exposure to human immunodeficiency virus [HIV]: Secondary | ICD-10-CM

## 2014-05-20 DIAGNOSIS — R030 Elevated blood-pressure reading, without diagnosis of hypertension: Secondary | ICD-10-CM

## 2014-05-20 DIAGNOSIS — IMO0001 Reserved for inherently not codable concepts without codable children: Secondary | ICD-10-CM

## 2014-05-20 LAB — TSH: TSH: 1.009 u[IU]/mL (ref 0.350–4.500)

## 2014-05-20 MED ORDER — NORGESTIM-ETH ESTRAD TRIPHASIC 0.18/0.215/0.25 MG-25 MCG PO TABS: 1.0000 | ORAL_TABLET | Freq: Every day | ORAL | Status: DC

## 2014-05-20 NOTE — Patient Instructions (Addendum)
Your blood pressure was elevated today. Please try and check it elsewhere to ensure that it isn't running high often.  Normal is <135/85. If consistently >140/90 then treatment is recommended.  Limiting the sodium intake and getting daily exercise helps keep the blood pressure down.  See below for low sodium diet.   . Start birth control pills the Sunday after your next period BEGINS.  Stop it if you develop migraines, or any other side effects.  You do need to try and take them about the same time every day.  It might take 2-3 months to completely regulate the cycle--expect some possible bleeding during the pack initially.  If you are having irregular bleeding, please keep track of it on your calendar (along with where you are in the pill pack at the time).  Birth control pills can raise the blood pressure, which is already up today, so please try and keep track of it (and ensure that it isn't really high consistently prior to starting).  Consider Truvada for post-exposure prophylaxis--this means to prevent contracting HIV from your husband, since you aren't reliably using condoms currently.  Low-Sodium Eating Plan Sodium raises blood pressure and causes water to be held in the body. Getting less sodium from food will help lower your blood pressure, reduce any swelling, and protect your heart, liver, and kidneys. We get sodium by adding salt (sodium chloride) to food. Most of our sodium comes from canned, boxed, and frozen foods. Restaurant foods, fast foods, and pizza are also very high in sodium. Even if you take medicine to lower your blood pressure or to reduce fluid in your body, getting less sodium from your food is important. WHAT IS MY PLAN? Most people should limit their sodium intake to 2,300 mg a day. Your health care provider recommends that you limit your sodium intake to __________ a day.  WHAT DO I NEED TO KNOW ABOUT THIS EATING PLAN? For the low-sodium eating plan, you will follow these  general guidelines:  Choose foods with a % Daily Value for sodium of less than 5% (as listed on the food label).   Use salt-free seasonings or herbs instead of table salt or sea salt.   Check with your health care provider or pharmacist before using salt substitutes.   Eat fresh foods.  Eat more vegetables and fruits.  Limit canned vegetables. If you do use them, rinse them well to decrease the sodium.   Limit cheese to 1 oz (28 g) per day.   Eat lower-sodium products, often labeled as "lower sodium" or "no salt added."  Avoid foods that contain monosodium glutamate (MSG). MSG is sometimes added to [place] food and some canned foods.  Check food labels (Nutrition Facts labels) on foods to learn how much sodium is in one serving.  Eat more home-cooked food and less restaurant, buffet, and fast food.  When eating at a restaurant, ask that your food be prepared with less salt or none, if possible.  HOW DO I READ FOOD LABELS FOR SODIUM INFORMATION? The Nutrition Facts label lists the amount of sodium in one serving of the food. If you eat more than one serving, you must multiply the listed amount of sodium by the number of servings. Food labels may also identify foods as:  Sodium free--Less than 5 mg in a serving.  Very low sodium--35 mg or less in a serving.  Low sodium--140 mg or less in a serving.  Light in sodium--50% less sodium in a serving.  For example, if a food that usually has 300 mg of sodium is changed to become light in sodium, it will have 150 mg of sodium.  Reduced sodium--25% less sodium in a serving. For example, if a food that usually has 400 mg of sodium is changed to reduced sodium, it will have 300 mg of sodium. WHAT FOODS CAN I EAT? Grains Low-sodium cereals, including oats, puffed wheat and rice, and shredded wheat cereals. Low-sodium crackers. Unsalted rice and pasta. Lower-sodium bread.  Vegetables Frozen or fresh vegetables. Low-sodium or  reduced-sodium canned vegetables. Low-sodium or reduced-sodium tomato sauce and paste. Low-sodium or reduced-sodium tomato and vegetable juices.  Fruits Fresh, frozen, and canned fruit. Fruit juice.  Meat and Other Protein Products Low-sodium canned tuna and salmon. Fresh or frozen meat, poultry, seafood, and fish. Lamb. Unsalted nuts. Dried beans, peas, and lentils without added salt. Unsalted canned beans. Homemade soups without salt. Eggs.  Dairy Milk. Soy milk. Ricotta cheese. Low-sodium or reduced-sodium cheeses. Yogurt.  Condiments Fresh and dried herbs and spices. Salt-free seasonings. Onion and garlic powders. Low-sodium varieties of mustard and ketchup. Lemon juice.  Fats and Oils Reduced-sodium salad dressings. Unsalted butter.  Other Unsalted popcorn and pretzels.  The items listed above may not be a complete list of recommended foods or beverages. Contact your dietitian for more options. WHAT FOODS ARE NOT RECOMMENDED? Grains Instant hot cereals. Bread stuffing, pancake, and biscuit mixes. Croutons. Seasoned rice or pasta mixes. Noodle soup cups. Boxed or frozen macaroni and cheese. Self-rising flour. Regular salted crackers. Vegetables Regular canned vegetables. Regular canned tomato sauce and paste. Regular tomato and vegetable juices. Frozen vegetables in sauces. Salted french fries. Olives. Rosita Fire. Relishes. Sauerkraut. Salsa. Meat and Other Protein Products Salted, canned, smoked, spiced, or pickled meats, seafood, or fish. Bacon, ham, sausage, hot dogs, corned beef, chipped beef, and packaged luncheon meats. Salt pork. Jerky. Pickled herring. Anchovies, regular canned tuna, and sardines. Salted nuts. Dairy Processed cheese and cheese spreads. Cheese curds. Blue cheese and cottage cheese. Buttermilk.  Condiments Onion and garlic salt, seasoned salt, table salt, and sea salt. Canned and packaged gravies. Worcestershire sauce. Tartar sauce. Barbecue sauce.  Teriyaki sauce. Soy sauce, including reduced sodium. Steak sauce. Fish sauce. Oyster sauce. Cocktail sauce. Horseradish. Regular ketchup and mustard. Meat flavorings and tenderizers. Bouillon cubes. Hot sauce. Tabasco sauce. Marinades. Taco seasonings. Relishes. Fats and Oils Regular salad dressings. Salted butter. Margarine. Ghee. Bacon fat.  Other Potato and tortilla chips. Corn chips and puffs. Salted popcorn and pretzels. Canned or dried soups. Pizza. Frozen entrees and pot pies.  The items listed above may not be a complete list of foods and beverages to avoid. Contact your dietitian for more information. Document Released: 10/16/2001 Document Revised: 05/01/2013 Document Reviewed: 02/28/2013 [facility] Patient Information 2015 [place], [place]. This information is not intended to replace advice given to you by your health care provider. Make sure you discuss any questions you have with your health care provider.   Emtricitabine; Tenofovir tablets What is this medicine? EMTRICITABINE; TENOFOVIR, PMPA (em tri SIT uh bean; te NOE fo veer) is two antiretroviral medicines in one tablet. It is used with other medicines to treat HIV and with safe sex practices to prevent HIV in high risk persons. This medicine is not a cure for HIV. This medicine may be used for other purposes; ask your health care provider or pharmacist if you have questions. COMMON BRAND NAME(S): Truvada What should I tell my health care provider before I take this medicine? They  need to know if you have any of these conditions: -bone problems -kidney disease -liver disease -an unusual or allergic reaction to emtricitabine, tenofovir, other medicines, foods, dyes, or preservatives -pregnant or trying to get pregnant -breast-feeding How should I use this medicine? Take this medicine by mouth with a glass of water. Follow the directions on the prescription label. You can take it with or without food. If it upsets your  stomach, take it with food. Take your medicine at regular intervals. Do not take your medicine more often than directed. For your anti-HIV therapy to work as well as possible, take each dose exactly as prescribed. Do not skip doses or stop your medicine even if you feel better. Skipping doses may make the HIV virus resistant to this medicine and other medicines. Do not stop taking except on your doctor's advice. Talk to your pediatrician regarding the use of this medicine in children. Special care may be needed. Overdosage: If you think you have taken too much of this medicine contact a poison control center or emergency room at once. NOTE: This medicine is only for you. Do not share this medicine with others. What if I miss a dose? If you miss a dose, take it as soon as you can. If it is almost time for your next dose, take only that dose. Do not take double or extra doses. What may interact with this medicine? Do not take this medicine with any of the following medications: -adefovir -any medicine that contains lamivudine -any medicine that contains emtricitabine or tenofovir This medicine may also interact with the following medications: -atazanavir -didanosine, ddI -lopinavir; ritonavir -medicines for viral infections like cidofovir, acyclovir, valacyclovir, ganciclovir, valganciclovir -saquinavir This list may not describe all possible interactions. Give your health care provider a list of all the medicines, herbs, non-prescription drugs, or dietary supplements you use. Also tell them if you smoke, drink alcohol, or use illegal drugs. Some items may interact with your medicine. What should I watch for while using this medicine? Visit your doctor or health care professional for regular check ups. Discuss any new symptoms with your doctor. You will need to have important blood work done while on this medicine. HIV is spread to others through sexual or blood contact. Talk to your doctor about  how to stop the spread of HIV. If you have hepatitis B, talk to your doctor if you plan to stop this medicine. The symptoms of hepatitis B may get worse if you stop this medicine. What side effects may I notice from receiving this medicine? Side effects that you should report to your doctor or health care professional as soon as possible: -allergic reactions like skin rash, itching or hives, swelling of the face, lips, or tongue -breathing difficulties -dark urine -general ill feeling or flu-like symptoms -light-colored stools -loss of appetite -nausea, vomiting, unusual stomach upset or pain -right upper belly pain -trouble passing urine or change in the amount of urine -unusually weak or tired -yellowing of the eyes or skin Side effects that usually do not require medical attention (report to your doctor or health care professional if they continue or are bothersome): -diarrhea -dizziness -headache -skin discoloration on the hands or feet -weight gain around waist, back, or thinning of face, arms, legs This list may not describe all possible side effects. Call your doctor for medical advice about side effects. You may report side effects to FDA at 1-800-FDA-1088. Where should I keep my medicine? Keep out of the reach of  children. Store at room temperature between 15 and 30 degrees C (59 and 86 degrees F). Throw away any unused medicine after the expiration date. NOTE: This sheet is a summary. It may not cover all possible information. If you have questions about this medicine, talk to your doctor, pharmacist, or health care provider.  2015, Elsevier/Gold Standard. (2010-11-23 18:05:15)

## 2014-05-20 NOTE — Progress Notes (Signed)
Chief Complaint  Patient presents with  . Advice Only    desires to discuss birth control options.    "My periods are out of whack".  Between October 31 and November 30 she had 3 cycles, seems too frequent. Since then she also had periods on 12/18 (or close) and 1/5. Seems to be cycling every 3 weeks, although there was an extra one in November; she was under stress that month. She wants to discuss taking birth control pills to regulate her cycles.  She took them in the past, after each child (20years ago, and 6-7 years ago).  The first time she took it, she took them for 10 years without problems. More recently (after second child), she didn't tolerate the pills--she would miss pills sometimes, and had spotting.  Denies having had migraines, significant nausea, blood clots or other side effects. Didn't like all the spotting she had, so stopped after taking it just 1-2 months.  Shortly after that her husband had the vasectomy.  Her husband has HIV, doing well.  She admits they have only been using condoms about 25% of the time. Denies vaginal discharge, odor, itch.  She currently takes Crestor daily, sometimes forgets--plans to change to evening dosing to make it easier to remember  PMH, PSH, SH reviewed and updated. Outpatient Encounter Prescriptions as of 05/20/2014  Medication Sig  . rosuvastatin (CRESTOR) 10 MG tablet Take 1 tablet (10 mg total) by mouth daily.  . Norgestimate-Ethinyl Estradiol Triphasic (ORTHO TRI-CYCLEN LO) 0.18/0.215/0.25 MG-25 MCG tab Take 1 tablet by mouth daily.  . [DISCONTINUED] ergocalciferol (VITAMIN D2) 50000 UNITS capsule Take 1 capsule (50,000 Units total) by mouth once a week.   (OCPs started today)  No Known Allergies  ROS: no fevers, chills, hair/skin/bowel/energy/weight/mood changes.  No URI symptoms, chest pain, shortness of breath, headaches, dizziness, edema or other concerns.    PHYSICAL EXAM: BP 140/94 mmHg  Pulse 76  Ht 5\' 8"  (1.727 m)  Wt 201  lb (91.173 kg)  BMI 30.57 kg/m2  LMP 05/14/2014  146/94 on repeat by MD Pleasant female in no distress Neck: no lymphadenopathy, thyromegaly or mass Heart: regular rate and rhythm Lungs: clear bilaterally Abdomen: soft, nontender, no mass Extremities: no edema normal pulse Psych: normal mood, affect, hygiene and grooming  ASSESSMENT/PLAN:  Irregular menses - trial of OCP's to regulate. Reviewed risks/side effects, how to take - Plan: TSH, Norgestimate-Ethinyl Estradiol Triphasic (ORTHO TRI-CYCLEN LO) 0.18/0.215/0.25 MG-25 MCG tab  Exposure to HIV - not very compliant with condoms, at risk of contracting HIV.  To consider Truvada prophylaxis. Check HIV today - Plan: HIV antibody  Elevated blood pressure - previously normal.  reviewed low sodium diet, monitor elsewhere.  OCP's may further increase BP, so monitoring is important.   Elevated BP today, normal in past TSH, HIV  Discussed Truvada--to consider F/u as scheduled for CPE in March

## 2014-05-21 LAB — HIV ANTIBODY (ROUTINE TESTING W REFLEX): HIV 1&2 Ab, 4th Generation: NONREACTIVE

## 2014-05-28 ENCOUNTER — Encounter: Payer: Self-pay | Admitting: Family Medicine

## 2014-05-29 ENCOUNTER — Telehealth: Payer: Self-pay | Admitting: Internal Medicine

## 2014-05-29 DIAGNOSIS — E7800 Pure hypercholesterolemia, unspecified: Secondary | ICD-10-CM

## 2014-05-29 MED ORDER — ROSUVASTATIN CALCIUM 10 MG PO TABS: 10.0000 mg | ORAL_TABLET | Freq: Every day | ORAL | Status: DC

## 2014-05-29 NOTE — Telephone Encounter (Signed)
Pt needs a refill on crestor 10mg  [facility]

## 2014-05-29 NOTE — Telephone Encounter (Signed)
Done

## 2014-07-24 ENCOUNTER — Encounter: Payer: Self-pay | Admitting: Family Medicine

## 2014-07-24 ENCOUNTER — Ambulatory Visit (INDEPENDENT_AMBULATORY_CARE_PROVIDER_SITE_OTHER): Payer: BLUE CROSS/BLUE SHIELD | Admitting: Family Medicine

## 2014-07-24 VITALS — BP 120/84 | HR 80 | Ht 68.0 in | Wt 191.2 lb

## 2014-07-24 DIAGNOSIS — N920 Excessive and frequent menstruation with regular cycle: Secondary | ICD-10-CM

## 2014-07-24 DIAGNOSIS — E7800 Pure hypercholesterolemia, unspecified: Secondary | ICD-10-CM

## 2014-07-24 DIAGNOSIS — Z0000 Encounter for general adult medical examination without abnormal findings: Secondary | ICD-10-CM

## 2014-07-24 DIAGNOSIS — Z206 Contact with and (suspected) exposure to human immunodeficiency virus [HIV]: Secondary | ICD-10-CM | POA: Diagnosis not present

## 2014-07-24 DIAGNOSIS — E559 Vitamin D deficiency, unspecified: Secondary | ICD-10-CM | POA: Diagnosis not present

## 2014-07-24 LAB — POCT URINALYSIS DIPSTICK
Bilirubin, UA: NEGATIVE
Glucose, UA: NEGATIVE
Leukocytes, UA: NEGATIVE
Nitrite, UA: NEGATIVE
PH UA: 6
PROTEIN UA: NEGATIVE
RBC UA: NEGATIVE
SPEC GRAV UA: 1.025
Urobilinogen, UA: NEGATIVE

## 2014-07-24 LAB — LIPID PANEL
CHOL/HDL RATIO: 3.3 ratio
Cholesterol: 122 mg/dL (ref 0–200)
HDL: 37 mg/dL — ABNORMAL LOW (ref >=46)
LDL CALC: 73 mg/dL (ref 0–99)
Triglycerides: 59 mg/dL (ref <150)
VLDL: 12 mg/dL (ref 0–40)

## 2014-07-24 LAB — CBC WITH DIFFERENTIAL/PLATELET
Basophils Absolute: 0 10*3/uL (ref 0.0–0.1)
Basophils Relative: 1 % (ref 0–1)
EOS ABS: 0 10*3/uL (ref 0.0–0.7)
Eosinophils Relative: 1 % (ref 0–5)
HCT: 37.4 % (ref 36.0–46.0)
Hemoglobin: 12.2 g/dL (ref 12.0–15.0)
LYMPHS ABS: 1.9 10*3/uL (ref 0.7–4.0)
LYMPHS PCT: 49 % — AB (ref 12–46)
MCH: 27.5 pg (ref 26.0–34.0)
MCHC: 32.6 g/dL (ref 30.0–36.0)
MCV: 84.2 fL (ref 78.0–100.0)
MONOS PCT: 8 % (ref 3–12)
MPV: 10.5 fL (ref 8.6–12.4)
Monocytes Absolute: 0.3 10*3/uL (ref 0.1–1.0)
Neutro Abs: 1.6 10*3/uL — ABNORMAL LOW (ref 1.7–7.7)
Neutrophils Relative %: 41 % — ABNORMAL LOW (ref 43–77)
PLATELETS: 283 10*3/uL (ref 150–400)
RBC: 4.44 MIL/uL (ref 3.87–5.11)
RDW: 13.7 % (ref 11.5–15.5)
WBC: 3.8 10*3/uL — AB (ref 4.0–10.5)

## 2014-07-24 LAB — COMPREHENSIVE METABOLIC PANEL
ALT: 11 U/L (ref 0–35)
AST: 19 U/L (ref 0–37)
Albumin: 4.3 g/dL (ref 3.5–5.2)
Alkaline Phosphatase: 58 U/L (ref 39–117)
BILIRUBIN TOTAL: 0.5 mg/dL (ref 0.2–1.2)
BUN: 11 mg/dL (ref 6–23)
CO2: 23 mEq/L (ref 19–32)
Calcium: 9.2 mg/dL (ref 8.4–10.5)
Chloride: 104 mEq/L (ref 96–112)
Creat: 0.88 mg/dL (ref 0.50–1.10)
Glucose, Bld: 78 mg/dL (ref 70–99)
Potassium: 4.4 mEq/L (ref 3.5–5.3)
SODIUM: 138 meq/L (ref 135–145)
Total Protein: 7.2 g/dL (ref 6.0–8.3)

## 2014-07-24 NOTE — Progress Notes (Signed)
Chief Complaint  Patient presents with  . Annual Exam    fasting annual exam with pap. No concerns.   Chelsea Gould is a 39 y.o. female who presents for a complete physical.  She has the following concerns:  Hyperlipidemia:  Crestor was started in August.  She changed to evening dosing 2 months ago, and has been easier to remember. She denies side effects.  She is following a lowfat, low cholesterol diet.  Irregular menses, as discussed at her January visit.  She reports the day of her last visit was "a really bad day"--stress related to work, had seen something related to her sister-in-law that had passed away that made her very emotional, etc.  She never ended up filling the birth control pills.  Periods are back to every 18-21 days, which has been her usual pattern.  LMP 2/24, and she no longer is concerned about it, no longer irregular, and not interested in starting the pills.  Vitamin D deficiency: she has been compliant with taking a supplement daily.  Urge urinary incontinence: much better--she goes more frequently, rather than waiting for long time.  Immunization History  Administered Date(s) Administered  . Influenza Split 02/08/2011  . Influenza,inj,Quad PF,36+ Mos 05/17/2013, 12/31/2013  . Tdap 02/08/2011   Last Pap smear: 07/2013; negative, with no high risk HPV Last mammogram: never  Last colonoscopy: never  Last DEXA: never  Ophtho:2014 (has glasses) Dentist: previously went yearly, but needs to find new one. Last was 3 years ago Exercise: elliptical once a week (maybe)  HIV test negative 05/2014 Normal TSH 05/2014  Past Medical History  Diagnosis Date  . Pure hypercholesterolemia   . Asthma     childhood    Past Surgical History  Procedure Laterality Date  . Oophorectomy  2003    for ovarian cysts--LEFT    History   Social History  . Marital Status: Married    Spouse Name: N/A  . Number of Children: 2  . Years of Education: N/A   Occupational  History  . OFFICE MANAGER    Social History Main Topics  . Smoking status: Never Smoker   . Smokeless tobacco: Never Used  . Alcohol Use: Yes     Comment: 1 drink per month  . Drug Use: No  . Sexual Activity:    Partners: Male    Birth Control/ Protection: Other-see comments     Comment: husband with vasectomy; also uses condoms (but admits not 100% of the time). Husband with HIV   Other Topics Concern  . Not on file   Social History Narrative   Lives with husband, 2 sons.  Husband has HIV, and doing well on meds (diagnosed in 2011)    Family History  Problem Relation Age of Onset  . Heart disease Mother 81    CABG at 37  . Hyperlipidemia Mother   . Hypertension Mother   . Diabetes Mother   . Arthritis Father   . Asthma Son   . Cancer Neg Hx   . Asthma Son   . Depression Sister   . Diabetes Maternal Aunt   . Diabetes Maternal Uncle     Outpatient Encounter Prescriptions as of 07/24/2014  Medication Sig Note  . cholecalciferol (VITAMIN D) 1000 UNITS tablet Take 1,000 Units by mouth daily.   . rosuvastatin (CRESTOR) 10 MG tablet Take 1 tablet (10 mg total) by mouth daily.   . [DISCONTINUED] Norgestimate-Ethinyl Estradiol Triphasic (ORTHO TRI-CYCLEN LO) 0.18/0.215/0.25 MG-25 MCG tab Take  1 tablet by mouth daily. (Patient not taking: Reported on 07/24/2014) 07/24/2014: Never started    No Known Allergies  ROS: The patient denies anorexia, fever, headaches (just occasional), vision changes, decreased hearing, ear pain, sore throat, breast concerns, chest pain, palpitations, dizziness, syncope, dyspnea on exertion, cough, swelling, nausea, vomiting, diarrhea, constipation, abdominal pain, melena, hematochezia, indigestion/heartburn, hematuria, vaginal discharge, odor or itch, genital lesions, joint pains, numbness, tingling, weakness, tremor, suspicious skin lesions, depression, anxiety, abnormal bleeding/bruising, or enlarged lymph nodes. Occasional LBP (not  frequent) Intentional 10# weight loss in the last 2 months (went on a diet) Seasonal allergies started recently, using Zyrtec prn. Periods are every 18-21 days Nausea when she eats broccoli, spinach, green beans   PHYSICAL EXAM:  BP 120/84 mmHg  Pulse 80  Ht '5\' 8"'  (1.727 m)  Wt 191 lb 3.2 oz (86.728 kg)  BMI 29.08 kg/m2  LMP 07/03/2014  General Appearance:  Alert, cooperative, no distress, appears stated age   Head:  Normocephalic, without obvious abnormality, atraumatic   Eyes:  PERRL, conjunctiva/corneas clear, EOM's intact, fundi  benign   Ears:  Normal TM's and external ear canals   Nose:  Nares normal, mucosa normal, no drainage or sinus tenderness   Throat:  Lips, mucosa, and tongue normal; teeth and gums normal   Neck:  Supple, no lymphadenopathy; thyroid: no enlargement/tenderness/nodules; no carotid  bruit or JVD   Back:  Spine nontender, no curvature, ROM normal, no CVA tenderness   Lungs:  Clear to auscultation bilaterally without wheezes, rales or ronchi; respirations unlabored   Chest Wall:  No tenderness or deformity   Heart:  Regular rate and rhythm, S1 and S2 normal, no murmur, rub  or gallop   Breast Exam:  No tenderness, masses, or nipple discharge or inversion. No axillary lymphadenopathy. Mild tenderness L UOQ with some fibrocystic changes   Abdomen:  Soft, non-tender, nondistended, normoactive bowel sounds,  no masses, no hepatosplenomegaly   Genitalia:  Normal external genitalia without lesions. BUS and vagina normal; no cervical motion tenderness. Many nabothian cysts present.  Uterus and adnexa not enlarged, nontender, no masses. Pap not performed   Rectal:  Normal sphincter tone; no masses; heme negative brown stool  Extremities:  No clubbing, cyanosis or edema   Pulses:  2+ and symmetric all extremities   Skin:  Skin color, texture, turgor normal, no rashes or lesions   Lymph nodes:  Cervical, supraclavicular, and  axillary nodes normal   Neurologic:  CNII-XII intact, normal strength, sensation and gait; reflexes 2+ and symmetric in upper extremities, 1+ and symmetric in lower extremities    Psych:  Normal mood, affect, hygiene and grooming       Urine dip:  2+ ketones (she is fasting), otherwise normal.   ASSESSMENT/PLAN:  Annual physical exam - Plan: POCT Urinalysis Dipstick, Visual acuity screening, Lipid panel, Comprehensive metabolic panel, CBC with Differential/Platelet  Exposure to HIV - regular condom use encouraged to prevent transmission from husband.  consider PreP  Pure hypercholesterolemia - Plan: Lipid panel, Comprehensive metabolic panel  Vitamin D deficiency - Plan: Vit D  25 hydroxy (rtn osteoporosis monitoring)  Unusually frequent menses - back to her usual regular pattern of q18-21 days; declines OCP's - Plan: CBC with Differential/Platelet  Discussed monthly self breast exams and yearly mammograms after the age of 37; at least 30 minutes of aerobic activity at least 5 days/week; proper sunscreen use reviewed; healthy diet, including goals of calcium and vitamin D intake and alcohol recommendations (less than  or equal to 1 drink/day) reviewed; regular seatbelt use; changing batteries in smoke detectors. Immunization recommendations discussed--UTD. Colonoscopy recommendations reviewed--age 71, sooner prn    c-met, lipid, Vit D, CBC  [facility]--[facility] recommended Schedule dentist appointment

## 2014-07-24 NOTE — Patient Instructions (Signed)
  HEALTH MAINTENANCE RECOMMENDATIONS:  It is recommended that you get at least 30 minutes of aerobic exercise at least 5 days/week (for weight loss, you may need as much as 60-90 minutes). This can be any activity that gets your heart rate up. This can be divided in 10-15 minute intervals if needed, but try and build up your endurance at least once a week.  Weight bearing exercise is also recommended twice weekly.  Eat a healthy diet with lots of vegetables, fruits and fiber.  "Colorful" foods have a lot of vitamins (ie green vegetables, tomatoes, red peppers, etc).  Limit sweet tea, regular sodas and alcoholic beverages, all of which has a lot of calories and sugar.  Up to 1 alcoholic drink daily may be beneficial for women (unless trying to lose weight, watch sugars).  Drink a lot of water.  Calcium recommendations are 1200-1500 mg daily (1500 mg for postmenopausal women or women without ovaries), and vitamin D 1000 IU daily.  This should be obtained from diet and/or supplements (vitamins), and calcium should not be taken all at once, but in divided doses.  Monthly self breast exams and yearly mammograms for women over the age of 41 is recommended.  Sunscreen of at least SPF 30 should be used on all sun-exposed parts of the skin when outside between the hours of 10 am and 4 pm (not just when at beach or pool, but even with exercise, golf, tennis, and yard work!)  Use a sunscreen that says "broad spectrum" so it covers both UVA and UVB rays, and make sure to reapply every 1-2 hours.  Remember to change the batteries in your smoke detectors when changing your clock times in the spring and fall.  Use your seat belt every time you are in a car, and please drive safely and not be distracted with cell phones and texting while driving.   [facility]--I recommend the 3D mammogram Schedule dentist appointment  Please use condoms regularly! Consider PreP (pre-exposure  prophylaxis) with Truvada if not using condoms--discuss with your husband's doctor.

## 2014-07-25 ENCOUNTER — Encounter: Payer: Self-pay | Admitting: Family Medicine

## 2014-07-25 LAB — VITAMIN D 25 HYDROXY (VIT D DEFICIENCY, FRACTURES): VIT D 25 HYDROXY: 37 ng/mL (ref 30–100)

## 2014-09-23 ENCOUNTER — Other Ambulatory Visit: Payer: Self-pay | Admitting: *Deleted

## 2014-09-23 ENCOUNTER — Encounter: Payer: Self-pay | Admitting: Family Medicine

## 2014-09-23 DIAGNOSIS — E7800 Pure hypercholesterolemia, unspecified: Secondary | ICD-10-CM

## 2014-09-23 MED ORDER — ROSUVASTATIN CALCIUM 10 MG PO TABS: 10.0000 mg | ORAL_TABLET | Freq: Every day | ORAL | Status: DC

## 2015-06-19 ENCOUNTER — Encounter: Payer: Self-pay | Admitting: Family Medicine

## 2015-06-19 ENCOUNTER — Ambulatory Visit (INDEPENDENT_AMBULATORY_CARE_PROVIDER_SITE_OTHER): Payer: Managed Care, Other (non HMO) | Admitting: Family Medicine

## 2015-06-19 VITALS — BP 130/76 | HR 76 | Ht 68.0 in | Wt 209.0 lb

## 2015-06-19 DIAGNOSIS — M25531 Pain in right wrist: Secondary | ICD-10-CM | POA: Diagnosis not present

## 2015-06-19 MED ORDER — NAPROXEN 500 MG PO TABS: 500.0000 mg | ORAL_TABLET | Freq: Two times a day (BID) | ORAL | Status: DC

## 2015-06-19 NOTE — Patient Instructions (Signed)
  Take the naproxen twice daily with food, regularly until your symptoms completely resolve.  I suspect 1 week will be enough, but giving you enough medication for 2. Use the wrist brace while doing activities and working (not at night). Return in 2 weeks if not better. Try and avoid overusing it and doing activities which cause pain.    Take your prescribed anti-inflammatory medication with food; discontinue or cut back the dose if you develop stomach pain/discomfort/side effects.  Do not take other over-the-counter pain medications such as ibuprofen, advil, motrin, aleve, naproxen at the same time.  Do not use longer than recommended.  It is okay to use acetaminophen (tylenol) along with this medication.

## 2015-06-19 NOTE — Progress Notes (Signed)
Chief Complaint  Patient presents with  . Arm Pain    right arm pain for several years but has worsened and become ore frequent over the last 2 months.   . Flu Vaccine    declined.    Right wrist and forearm pain started 2 days ago--not as bad yesterday or today.  She used a wrist brace which seemed to help 2 days ago. When she put it on yesterday (after it was already feeling a little better), the brace seemed to cause a slight discomfort.  She has soreness in the palm and anterior arm, some discomfort in the forearm with typing and moving her fingers.  Some problems off/on for years, more discomfort over the last couple of months, then worse this week.  She took 4 ibuprofen on Tuesday, didn't help, so that's when she got the brace, which did help. She hasn't taken any further pain medication.  Denies numbness, tingling or weakness. Hurts to lift heavy things, sometimes feel like her grip won't hold, so switches hands.  Right handed, types a lot at work.  PMH, PSH, SH reviewed.  Outpatient Encounter Prescriptions as of 06/19/2015  Medication Sig  . cholecalciferol (VITAMIN D) 1000 UNITS tablet Take 1,000 Units by mouth daily.  . rosuvastatin (CRESTOR) 10 MG tablet Take 1 tablet (10 mg total) by mouth daily.   No facility-administered encounter medications on file as of 06/19/2015.   Evening primrose oil prn  No Known Allergies  ROS: no fever, chills, URI symptoms, chest pain, GI complaints, numbness, tingling, weakness, other joint pains, edema, bleeding, bruising, rash.  PHYSICAL EXAM: BP 130/76 mmHg  Pulse 76  Ht  (1.727 m)  Wt 209 lb (94.802 kg)  BMI 31.79 kg/m2  LMP 05/26/2015  Well developed, pleasant female in no distress RUE:  Area of discomfort--Medial aspect of the thenar eminence, into the central palmar area and the anterior wrist. Normal strength, no wasting/atrophy. Negative Phalen and Tinel.  Slight discomfort in this area (lower palm/wrist) on Phalen's  testing, but no other symptoms. Normal strength, sensation. Mild discomfort over the tendons of the thumb with thumb movements. Negative Finklestein test.  ASSESSMENT/PLAN:  Right wrist pain - suspect tendonitis vs CTS - Plan: naproxen (NAPROSYN) 500 MG tablet   Take the naproxen twice daily with food, regularly until your symptoms completely resolve.  I suspect 1 week will be enough, but giving you enough medication for 2. Use the wrist brace while doing activities and working (not at night). Return in 2 weeks if not better. Try and avoid overusing it and doing activities which cause pain.

## 2015-07-30 ENCOUNTER — Encounter: Payer: Self-pay | Admitting: Family Medicine

## 2015-07-30 ENCOUNTER — Ambulatory Visit (INDEPENDENT_AMBULATORY_CARE_PROVIDER_SITE_OTHER): Payer: Managed Care, Other (non HMO) | Admitting: Family Medicine

## 2015-07-30 VITALS — BP 128/72 | HR 80 | Ht 68.5 in | Wt 211.6 lb

## 2015-07-30 DIAGNOSIS — E559 Vitamin D deficiency, unspecified: Secondary | ICD-10-CM | POA: Diagnosis not present

## 2015-07-30 DIAGNOSIS — Z206 Contact with and (suspected) exposure to human immunodeficiency virus [HIV]: Secondary | ICD-10-CM

## 2015-07-30 DIAGNOSIS — N920 Excessive and frequent menstruation with regular cycle: Secondary | ICD-10-CM

## 2015-07-30 DIAGNOSIS — R635 Abnormal weight gain: Secondary | ICD-10-CM

## 2015-07-30 DIAGNOSIS — E7800 Pure hypercholesterolemia, unspecified: Secondary | ICD-10-CM

## 2015-07-30 DIAGNOSIS — Z0000 Encounter for general adult medical examination without abnormal findings: Secondary | ICD-10-CM

## 2015-07-30 LAB — CBC WITH DIFFERENTIAL/PLATELET
Basophils Absolute: 0 10*3/uL (ref 0.0–0.1)
Basophils Relative: 1 % (ref 0–1)
Eosinophils Absolute: 0.1 10*3/uL (ref 0.0–0.7)
Eosinophils Relative: 2 % (ref 0–5)
HEMATOCRIT: 37.3 % (ref 36.0–46.0)
Hemoglobin: 11.9 g/dL — ABNORMAL LOW (ref 12.0–15.0)
LYMPHS PCT: 42 % (ref 12–46)
Lymphs Abs: 1.9 10*3/uL (ref 0.7–4.0)
MCH: 26.9 pg (ref 26.0–34.0)
MCHC: 31.9 g/dL (ref 30.0–36.0)
MCV: 84.4 fL (ref 78.0–100.0)
MONO ABS: 0.3 10*3/uL (ref 0.1–1.0)
MONOS PCT: 7 % (ref 3–12)
MPV: 10.9 fL (ref 8.6–12.4)
NEUTROS ABS: 2.2 10*3/uL (ref 1.7–7.7)
Neutrophils Relative %: 48 % (ref 43–77)
Platelets: 323 10*3/uL (ref 150–400)
RBC: 4.42 MIL/uL (ref 3.87–5.11)
RDW: 13.9 % (ref 11.5–15.5)
WBC: 4.5 10*3/uL (ref 4.0–10.5)

## 2015-07-30 LAB — COMPREHENSIVE METABOLIC PANEL WITH GFR
ALT: 15 U/L (ref 6–29)
AST: 18 U/L (ref 10–30)
Albumin: 4.2 g/dL (ref 3.6–5.1)
Alkaline Phosphatase: 65 U/L (ref 33–115)
BUN: 14 mg/dL (ref 7–25)
CO2: 25 mmol/L (ref 20–31)
Calcium: 9.3 mg/dL (ref 8.6–10.2)
Chloride: 103 mmol/L (ref 98–110)
Creat: 0.88 mg/dL (ref 0.50–1.10)
Glucose, Bld: 94 mg/dL (ref 65–99)
Potassium: 4.5 mmol/L (ref 3.5–5.3)
Sodium: 137 mmol/L (ref 135–146)
Total Bilirubin: 0.4 mg/dL (ref 0.2–1.2)
Total Protein: 7.2 g/dL (ref 6.1–8.1)

## 2015-07-30 LAB — LIPID PANEL
Cholesterol: 191 mg/dL (ref 125–200)
HDL: 47 mg/dL
LDL Cholesterol: 130 mg/dL — ABNORMAL HIGH
Total CHOL/HDL Ratio: 4.1 ratio
Triglycerides: 69 mg/dL
VLDL: 14 mg/dL

## 2015-07-30 LAB — TSH: TSH: 1.21 m[IU]/L

## 2015-07-30 MED ORDER — ROSUVASTATIN CALCIUM 10 MG PO TABS: 10.0000 mg | ORAL_TABLET | Freq: Every day | ORAL | Status: DC

## 2015-07-30 NOTE — Patient Instructions (Signed)
  HEALTH MAINTENANCE RECOMMENDATIONS:  It is recommended that you get at least 30 minutes of aerobic exercise at least 5 days/week (for weight loss, you may need as much as 60-90 minutes). This can be any activity that gets your heart rate up. This can be divided in 10-15 minute intervals if needed, but try and build up your endurance at least once a week.  Weight bearing exercise is also recommended twice weekly.  Eat a healthy diet with lots of vegetables, fruits and fiber.  "Colorful" foods have a lot of vitamins (ie green vegetables, tomatoes, red peppers, etc).  Limit sweet tea, regular sodas and alcoholic beverages, all of which has a lot of calories and sugar.  Up to 1 alcoholic drink daily may be beneficial for women (unless trying to lose weight, watch sugars).  Drink a lot of water.  Calcium recommendations are 1200-1500 mg daily (1500 mg for postmenopausal women or women without ovaries), and vitamin D 1000 IU daily.  This should be obtained from diet and/or supplements (vitamins), and calcium should not be taken all at once, but in divided doses.  Monthly self breast exams and yearly mammograms for women over the age of 42 is recommended.  Sunscreen of at least SPF 30 should be used on all sun-exposed parts of the skin when outside between the hours of 10 am and 4 pm (not just when at beach or pool, but even with exercise, golf, tennis, and yard work!)  Use a sunscreen that says "broad spectrum" so it covers both UVA and UVB rays, and make sure to reapply every 1-2 hours.  Remember to change the batteries in your smoke detectors when changing your clock times in the spring and fall.  Use your seat belt every time you are in a car, and please drive safely and not be distracted with cell phones and texting while driving.    Please schedule a routine dental appointment. Please call the [facility] and schedule a routine screening mammogram. Add in weight-bearing exercise as  discussed. Take your vitamin D 1000 IU every night--okay to take the same time as your Crestor (morning or evening--whichever routine is easiest for you to remember. If you forget a dose of the vitamin D, it is okay to double up the next day (for this dose). We will be in contact with your test results--if it is VERY low, we might temporarily have you use a higher dose.

## 2015-07-30 NOTE — Progress Notes (Signed)
Chief Complaint  Patient presents with  . Annual Exam    fasting annual exam with pap, is on last day of cycle. No concerns.   Chelsea Gould is a 40 y.o. female who presents for a complete physical.  She has the following concerns:  Seen recently with right wrist pain, told possible tendonitis vs CTS. Treated with naproxen and a wrist brace. She took the naproxen for a week, along with using the brace, and pain completely resolved.  She was laid off from her job 3 weeks ago (so not typing, using arm as much).  Hyperlipidemia: on Crestor, takes each evening, and denies side effects. Admits that since losing her job a few weeks ago, she has been more sporadic, only remembering to take it about twice a week, got off a regular schedule.  Prior to that was very consistent in taking it daily. She is following a lowfat, low cholesterol diet.  Menses are mostly regular, more frequent than others, but typical for her.  Not particularly heavy, sometimes has 2/month, but this month was later than usual.  Vitamin D deficiency: she has been compliant with taking a supplement daily.    Immunization History  Administered Date(s) Administered  . Influenza Split 02/08/2011  . Influenza,inj,Quad PF,36+ Mos 05/17/2013, 12/31/2013  . Tdap 02/08/2011  didn't get a flu shot this year, declines Last Pap smear: 07/2013; negative, with no high risk HPV Last mammogram: never  Last colonoscopy: never  Last DEXA: never  Ophtho:2015 (has glasses) Dentist: 4 years ago, had gone regularly prior--plans to get a new dentist Exercise: walking on treadmill, going to the gym (bike, ab machine), walking outside.  Exercising daily since losing her job; prior to this was 1-2x/week.  HIV test negative 05/2014 Normal TSH 05/2014  Past Medical History  Diagnosis Date  . Pure hypercholesterolemia   . Asthma     childhood    Past Surgical History  Procedure Laterality Date  . Oophorectomy  2003    for ovarian  cysts--LEFT    Social History   Social History  . Marital Status: Married    Spouse Name: N/A  . Number of Children: 2  . Years of Education: N/A   Occupational History  . OFFICE MANAGER    Social History Main Topics  . Smoking status: Never Smoker   . Smokeless tobacco: Never Used  . Alcohol Use: 0.0 oz/week    0 Standard drinks or equivalent per week     Comment: 1 drink per month  . Drug Use: No  . Sexual Activity:    Partners: Male    Birth Control/ Protection: Other-see comments     Comment: husband with vasectomy; also uses condoms. Husband with HIV   Other Topics Concern  . Not on file   Social History Narrative   Lives with husband, 2 sons. Oldest lives in [place]. 4 stepchildren--stepdaughter is also living with them. Husband has HIV, and doing well on meds (diagnosed in 2011). She was laid off from work 07/2015 (when [employer] changed to [employer])    Family History  Problem Relation Age of Onset  . Heart disease Mother 59    CABG at 60  . Hyperlipidemia Mother   . Hypertension Mother   . Diabetes Mother   . Arthritis Father   . Asthma Son   . Cancer Neg Hx   . Asthma Son   . Depression Sister   . Hypertension Sister   . Diabetes Maternal Aunt   .  Diabetes Maternal Uncle     Outpatient Encounter Prescriptions as of 07/30/2015  Medication Sig Note  . Evening Primrose Oil 1000 MG CAPS Take by mouth.   . Ginkgo Biloba (GNP GINGKO BILOBA EXTRACT PO) Take 1 capsule by mouth daily.   . rosuvastatin (CRESTOR) 10 MG tablet Take 1 tablet (10 mg total) by mouth daily. 07/30/2015: Missing a lot frequently in the last few weeks (only taking 2x/wk; had been regular prior to this)  . [DISCONTINUED] Evening Primrose topical oil Apply 1 capsule topically daily.   . cholecalciferol (VITAMIN D) 1000 UNITS tablet Take 1,000 Units by mouth daily. Reported on 07/30/2015 07/30/2015: Takes very sporadically (2-3 times/month)  . [DISCONTINUED] naproxen (NAPROSYN) 500 MG  tablet Take 1 tablet (500 mg total) by mouth 2 (two) times daily with a meal.    No facility-administered encounter medications on file as of 07/30/2015.    No Known Allergies  ROS: The patient denies anorexia, fever, headaches (just occasional), vision changes, decreased hearing, ear pain, sore throat, breast concerns, chest pain, palpitations, dizziness, syncope, dyspnea on exertion, cough, swelling, nausea, vomiting, diarrhea, constipation, abdominal pain, melena, hematochezia, indigestion/heartburn, hematuria, vaginal discharge, odor or itch, genital lesions, joint pains, numbness, tingling, weakness, tremor, suspicious skin lesions, depression, anxiety, abnormal bleeding/bruising, or enlarged lymph nodes. Occasional LBP (less often than in the past, infrequent). Seasonal allergies started recently, using Zyrtec prn. Frequent menses, unchanged +10# weight gain in the last year (had lost this weight prior to last phyiscal, regained).  PHYSICAL EXAM:  BP 128/72 mmHg  Pulse 80  Ht 5' 8.5" (1.74 m)  Wt 211 lb 9.6 oz (95.981 kg)  BMI 31.70 kg/m2  LMP 07/26/2015  General Appearance:  Alert, cooperative, no distress, appears stated age   Head:  Normocephalic, without obvious abnormality, atraumatic   Eyes:  PERRL, conjunctiva/corneas clear, EOM's intact, fundi  benign   Ears:  Normal TM's and external ear canals   Nose:  Nares normal, mucosa normal, no drainage or sinus tenderness   Throat:  Lips, mucosa, and tongue normal; teeth and gums normal   Neck:  Supple, no lymphadenopathy; thyroid: no enlargement/tenderness/nodules; no carotid  bruit or JVD   Back:  Spine nontender, no curvature, ROM normal, no CVA tenderness   Lungs:  Clear to auscultation bilaterally without wheezes, rales or ronchi; respirations unlabored   Chest Wall:  No tenderness or deformity   Heart:  Regular rate and rhythm, S1 and S2 normal, no murmur, rub  or gallop    Breast Exam:  No tenderness, masses, or nipple discharge or inversion. No axillary lymphadenopathy. Mild tenderness L UOQ with some fibrocystic changes   Abdomen:  Soft, non-tender, nondistended, normoactive bowel sounds,  no masses, no hepatosplenomegaly   Genitalia:  Normal external genitalia without lesions. BUS and vagina normal; no cervical motion tenderness.  Uterus and adnexa not enlarged, nontender, no masses. Pap not performed   Rectal:  Normal sphincter tone; no masses; heme negative brown stool  Extremities:  No clubbing, cyanosis or edema   Pulses:  2+ and symmetric all extremities   Skin:  Skin color, texture, turgor normal, no rashes or lesions   Lymph nodes:  Cervical, supraclavicular, and axillary nodes normal   Neurologic:  CNII-XII intact, normal strength, sensation and gait; reflexes 2+ and symmetric in upper extremities, 1+ and symmetric in lower extremities    Psych: Normal mood, affect, hygiene and grooming              ASSESSMENT/PLAN:  Annual  physical exam - Plan: Visual acuity screening, rosuvastatin (CRESTOR) 10 MG tablet, Lipid panel, Comprehensive metabolic panel, CBC with Differential/Platelet, VITAMIN D 25 Hydroxy (Vit-D Deficiency, Fractures), TSH, HIV antibody  Pure hypercholesterolemia - Plan: rosuvastatin (CRESTOR) 10 MG tablet, Lipid panel, Comprehensive metabolic panel  Vitamin D deficiency - Plan: VITAMIN D 25 Hydroxy (Vit-D Deficiency, Fractures)  Weight gain - Plan: TSH  Unusually frequent menses - Plan: CBC with Differential/Platelet  HIV exposure - Plan: HIV antibody   c-met, lipid, vit D, CBC, HIV, TSH  Discussed monthly self breast exams and yearly mammograms after the age of 52; at least 30 minutes of aerobic activity at least 5 days/week, add weight bearing exercise 2-3 times/week; proper sunscreen use reviewed; healthy diet, including goals of calcium and vitamin D intake  and alcohol recommendations (less than or equal to 1 drink/day) reviewed; regular seatbelt use; changing batteries in smoke detectors. Immunization recommendations discussed--declines flu shot today, recommended yearly. Colonoscopy recommendations reviewed--age 61, sooner prn    Please schedule a routine dental appointment. [facility] call the [facility] and schedule a routine screening mammogram. Add in weight-bearing exercise as discussed. Take your vitamin D 1000 IU every night--okay to take the same time as your Crestor (morning or evening--whichever routine is easiest for you to remember. If you forget a dose of the vitamin D, it is okay to double up the next day (for this dose). We will be in contact with your test results--if it is VERY low, we might temporarily have you use a higher dose.

## 2015-07-31 LAB — VITAMIN D 25 HYDROXY (VIT D DEFICIENCY, FRACTURES): Vit D, 25-Hydroxy: 23 ng/mL — ABNORMAL LOW (ref 30–100)

## 2015-07-31 LAB — HIV ANTIBODY (ROUTINE TESTING W REFLEX): HIV 1&2 Ab, 4th Generation: NONREACTIVE

## 2015-07-31 MED ORDER — VITAMIN D (ERGOCALCIFEROL) 1.25 MG (50000 UNIT) PO CAPS: 50000.0000 [IU] | ORAL_CAPSULE | ORAL | Status: DC

## 2015-07-31 NOTE — Addendum Note (Signed)
Addended by: Joselyn ArrowKNAPP, Shantai Tiedeman on: 07/31/2015 08:39 AM   Modules accepted: Orders

## 2016-01-08 ENCOUNTER — Encounter: Payer: Self-pay | Admitting: Family Medicine

## 2016-02-05 ENCOUNTER — Telehealth: Payer: Self-pay | Admitting: Family Medicine

## 2016-02-05 DIAGNOSIS — Z0000 Encounter for general adult medical examination without abnormal findings: Secondary | ICD-10-CM

## 2016-02-05 DIAGNOSIS — E7800 Pure hypercholesterolemia, unspecified: Secondary | ICD-10-CM

## 2016-02-05 MED ORDER — ROSUVASTATIN CALCIUM 10 MG PO TABS
10.0000 mg | ORAL_TABLET | Freq: Every day | ORAL | 1 refills | Status: DC
Start: 2016-02-05 — End: 2016-02-13

## 2016-02-05 NOTE — Telephone Encounter (Signed)
Pt come in and needs a refill on  rosuvastatin, pt uses [facility] [place] - [address] pt can be reached at [phone]

## 2016-02-05 NOTE — Telephone Encounter (Signed)
Done

## 2016-02-13 ENCOUNTER — Encounter: Payer: Self-pay | Admitting: Family Medicine

## 2016-02-13 DIAGNOSIS — E7800 Pure hypercholesterolemia, unspecified: Secondary | ICD-10-CM

## 2016-02-13 DIAGNOSIS — Z0000 Encounter for general adult medical examination without abnormal findings: Secondary | ICD-10-CM

## 2016-02-13 MED ORDER — ROSUVASTATIN CALCIUM 10 MG PO TABS: 10.0000 mg | ORAL_TABLET | Freq: Every day | ORAL | 0 refills | Status: DC

## 2016-04-13 ENCOUNTER — Other Ambulatory Visit: Payer: Self-pay | Admitting: Family Medicine

## 2016-04-13 ENCOUNTER — Encounter: Payer: Self-pay | Admitting: Family Medicine

## 2016-04-13 DIAGNOSIS — E7800 Pure hypercholesterolemia, unspecified: Secondary | ICD-10-CM

## 2016-04-13 DIAGNOSIS — Z0000 Encounter for general adult medical examination without abnormal findings: Secondary | ICD-10-CM

## 2016-04-13 MED ORDER — ROSUVASTATIN CALCIUM 10 MG PO TABS: 10.0000 mg | ORAL_TABLET | Freq: Every day | ORAL | 3 refills | Status: DC

## 2016-06-03 ENCOUNTER — Ambulatory Visit (INDEPENDENT_AMBULATORY_CARE_PROVIDER_SITE_OTHER): Payer: BLUE CROSS/BLUE SHIELD | Admitting: Family Medicine

## 2016-06-03 ENCOUNTER — Encounter: Payer: Self-pay | Admitting: Family Medicine

## 2016-06-03 VITALS — BP 124/78 | HR 64 | Temp 99.2°F | Ht 68.5 in | Wt 213.6 lb

## 2016-06-03 DIAGNOSIS — Z23 Encounter for immunization: Secondary | ICD-10-CM

## 2016-06-03 DIAGNOSIS — K219 Gastro-esophageal reflux disease without esophagitis: Secondary | ICD-10-CM | POA: Diagnosis not present

## 2016-06-03 DIAGNOSIS — R109 Unspecified abdominal pain: Secondary | ICD-10-CM | POA: Diagnosis not present

## 2016-06-03 LAB — POCT URINALYSIS DIPSTICK
Bilirubin, UA: NEGATIVE
Glucose, UA: NEGATIVE
Ketones, UA: NEGATIVE
NITRITE UA: NEGATIVE
PH UA: 6
Spec Grav, UA: >=1.03
Urobilinogen, UA: NEGATIVE

## 2016-06-03 NOTE — Progress Notes (Signed)
Chief Complaint  Patient presents with  . Abdominal Pain    x couple weeks but has worsened over the last 3-4 days. "Wrenching pain". Finishing up menstrual cycle now but different from these cramps. BM's are normal.    Described as a twisting, severe pain, above the umbilicus, comes/goes, lasts for 30 seconds at a time, on/off for hours. Today it started a few hours after she woke up. Doesn't relate to eating.  Today it eased off some while she ate, but recurred shortly afterwards.  Not really sure if it is related to food or not.  It hasn't woken her up from sleep, no pain at night.  Had some lower abdominal cramps related to her cycle, but very separate from the grinding pain in the upper stomach.   One day she had some pain at her right lateral abdomen--just once.  She had pain while checking in, resolved for most of the visit, then had some twinges of pain in LUQ during visit.  No associated nausea, vomiting.  Denies constipation, diarrhea, heartburn. Denies NSAID use; takes excedrin migraine 2-3 times/month, took some 4 days ago.  Has been having more spaghetti and pizza in the last couple of weeks. Also has had some increase in caffeine (coffee) recently.  Denies any change in activity, possible muscle strain or injury.  Has always had a "nervous stomach". +stressful job, not happy there. Worries about an ulcer.  Usually gets abdominal pain when angry, upset.  Pain has been worse in the last couple of weeks--stress at work has gotten worse, more layoffs, which affects her job.    She has been waking up wheezing at night 4 nights out of 7 over the last month.    She hasn't tried taking any OTC medications.  PMH, PSH, SH reviewed, updated  Outpatient Encounter Prescriptions as of 06/03/2016  Medication Sig Note  . cholecalciferol (VITAMIN D) 1000 UNITS tablet Take 1,000 Units by mouth daily. Reported on 07/30/2015 06/03/2016: daily  . Evening Primrose Oil 1000 MG CAPS Take by mouth.    . Ginkgo Biloba (GNP GINGKO BILOBA EXTRACT PO) Take 1 capsule by mouth daily.   . rosuvastatin (CRESTOR) 10 MG tablet Take 1 tablet (10 mg total) by mouth daily.   . [DISCONTINUED] Vitamin D, Ergocalciferol, (DRISDOL) 50000 units CAPS capsule Take 1 capsule (50,000 Units total) by mouth every 7 (seven) days.    No facility-administered encounter medications on file as of 06/03/2016.    No Known Allergies  ROS:  No fever, chills, dizziness, chest pain, shortness of breath (just the wheezing noted at night), URI symptoms, cough, urinary complaints, bleeding, bruising, rash, or other concerns.  +stress See HPI.  PHYSICAL EXAM:  BP 124/78 (BP Location: Left Arm, Patient Position: Sitting, Cuff Size: Normal)   Pulse 64   Temp 99.2 F (37.3 C) (Tympanic)   Ht 5' 8.5" (1.74 m)   Wt 213 lb 9.6 oz (96.9 kg)   LMP 05/30/2016 (Approximate)   BMI 32.01 kg/m   Pain developed LUQ during visit, appeared only mildly uncomfortable, short-lived HEENT: conjunctiva and sclera are clear, anicteric. OP is clear Neck: no lymphadenopathy or mass Heart: regular rate and rhythm Lungs: clear bilaterally, no wheezes, rales, ronchi Abdomen: soft.  Tender in epigastrium, spreading slightly to the left side. Mostly tender in the central area. Very slightly tender at the [facility], but no true Murphy sign.  Active bowel sounds.  No organomegaly or mass Extremities: no edema Back: No CVA tenderness Skin: normal  turgor, no rash Psych: normal mood, affect, hygiene and grooming (clearly unhappy about her job, when discussing it). Neuro: alert and oriented, cranial nerves intact, normal gait   Urine dip: 3+ blood, trace leuks, trace protein, neg nitrite, SG >=1.030   ASSESSMENT/PLAN:  Gastroesophageal reflux disease, esophagitis presence not specified - counseled re: diet, behavioral measures. Trial of Prilosec OTC (can increase to BID if daily ineffective); f/u 1-2 weeks if persists  Abdominal pain, unspecified  abdominal location - Plan: POCT Urinalysis Dipstick  Need for prophylactic vaccination and inoculation against influenza - Plan: Flu Vaccine QUAD 36+ mos PF IM (Fluarix & Fluzone Quad PF)  25 min visit, more than 1/2 spent counseling. Also counseled re: stress   I suspect that part of your abdominal pain may be related to acid refux. Take prilosec OTC once daily, before/with breakfast for two weeks.  If your pain is okay during the day, but it gets bad after dinner, you can take it twice daily (before breakfast and then with dinner). Cut back on caffeine, acidic foods, spicy foods.  Eat smaller meals more frequently rather than large meals. Wait at least 2-3 hours after eating before laying down.  You may also have a component of gas contributing to pain (stabbing sharp pains). Consider taking Gas-x (simethicone) as needed during the day for pain. Avoid anti-inflammatories (aspirin/excedrin/motrin/aleve). Try and use tylenol for pain instead.  Return in 1-2 weeks for recheck if not improving.  We may need to send you for an ultrasound to look at your gallbladder.  Your wheezing at night may be related to acid reflux--try elevating the head of the bed (chest above the level of the stomach, not just the head), and this should hopefully improve with using the prilosec.

## 2016-06-03 NOTE — Patient Instructions (Addendum)
  I suspect that part of your abdominal pain may be related to acid refux. Take prilosec OTC once daily, before/with breakfast for two weeks.  If your pain is okay during the day, but it gets bad after dinner, you can take it twice daily (before breakfast and then with dinner). Cut back on caffeine, acidic foods, spicy foods.  Eat smaller meals more frequently rather than large meals. Wait at least 2-3 hours after eating before laying down.  You may also have a component of gas contributing to pain (stabbing sharp pains). Consider taking Gas-x (simethicone) as needed during the day for pain. Avoid anti-inflammatories (aspirin/excedrin/motrin/aleve). Try and use tylenol for pain instead.  Return in 1-2 weeks for recheck if not improving.  We may need to send you for an ultrasound to look at your gallbladder.  Your wheezing at night may be related to acid reflux--try elevating the head of the bed (chest above the level of the stomach, not just the head), and this should hopefully improve with using the prilosec.  Food Choices for Gastroesophageal Reflux Disease, Adult When you have gastroesophageal reflux disease (GERD), the foods you eat and your eating habits are very important. Choosing the right foods can help ease the discomfort of GERD. What general guidelines do I need to follow?  Choose fruits, vegetables, whole grains, low-fat dairy products, and low-fat meat, fish, and poultry.  Limit fats such as oils, salad dressings, butter, nuts, and avocado.  Keep a food diary to identify foods that cause symptoms.  Avoid foods that cause reflux. These may be different for different people.  Eat frequent small meals instead of three large meals each day.  Eat your meals slowly, in a relaxed setting.  Limit fried foods.  Cook foods using methods other than frying.  Avoid drinking alcohol.  Avoid drinking large amounts of liquids with your meals.  Avoid bending over or lying down until  2-3 hours after eating. What foods are not recommended? The following are some foods and drinks that may worsen your symptoms: Vegetables  Tomatoes. Tomato juice. Tomato and spaghetti sauce. Chili peppers. Onion and garlic. Horseradish. Fruits  Oranges, grapefruit, and lemon (fruit and juice). Meats  High-fat meats, fish, and poultry. This includes hot dogs, ribs, ham, sausage, salami, and bacon. Dairy  Whole milk and chocolate milk. Sour cream. Cream. Butter. Ice cream. Cream cheese. Beverages  Coffee and tea, with or without caffeine. Carbonated beverages or energy drinks. Condiments  Hot sauce. Barbecue sauce. Sweets/Desserts  Chocolate and cocoa. Donuts. Peppermint and spearmint. Fats and Oils  High-fat foods, including [place] fries and potato chips. Other  Vinegar. Strong spices, such as black pepper, white pepper, red pepper, cayenne, curry powder, cloves, ginger, and chili powder. The items listed above may not be a complete list of foods and beverages to avoid. Contact your dietitian for more information.  This information is not intended to replace advice given to you by your health care provider. Make sure you discuss any questions you have with your health care provider. Document Released: 04/26/2005 Document Revised: 10/02/2015 Document Reviewed: 02/28/2013 Elsevier Interactive Patient Education  2017 [employer]

## 2016-06-18 ENCOUNTER — Encounter: Payer: Self-pay | Admitting: Family Medicine

## 2016-08-03 NOTE — Progress Notes (Signed)
Chief Complaint  Patient presents with  . Annual Exam    fasting annual exam with pap (last was 07/2013). Just had eye exam. No concerns.     Chelsea Gould is a 41 y.o. female who presents for a complete physical.  She has the following concerns:  Last seen in January with complaints of nght-time wheezing and abdominal pain. Diagnosed with reflux. Pain resolved after taking Prilosec and modifying diet.  Wheezing persisted longer, but has since resolved.  She took the Prilosec OTC for just two weeks, hasn't needed any since.  She uses Gas-X prn.  Vitamin D deficiency:  Last level was 23 last year. She has been taking 1000 IU daily.  Hyperlipidemia: on Crestor, takes each evening, and denies side effects.She is compliant with her medication. She is trying to following a lowfat, low cholesterol diet. Admits she has been using creamy dressings recently, and cheese. Eats scrambed eggs (2-3) every other day.   Menses are mostly regular (this month had 2).  They aren't as frequent as they used to be.  Not particularly heavy, just 2-3 days, lasting overall about 6 days.  She believes she may even have skipped a month.  Husband has HIV--admits to using condoms only 50% of the time. Previously discussed PreP--wasn't interested due to the price.  Immunization History  Administered Date(s) Administered  . Influenza Split 02/08/2011  . Influenza,inj,Quad PF,36+ Mos 05/17/2013, 12/31/2013, 06/03/2016  . Tdap 02/08/2011   Last Pap smear: 07/2013; negative, with no high risk HPV Last mammogram: never  Last colonoscopy: never  Last DEXA: never  Ophtho: 04/2016, every 2 years (has glasses) Dentist: A few years ago, had gone regularly prior--now has insurance again, and plans to schedule. Exercise: None recently.Marland Kitchen   HIV test negative 05/2014  Past Medical History:  Diagnosis Date  . Asthma    childhood  . GERD (gastroesophageal reflux disease)   . Pure hypercholesterolemia     Past Surgical  History:  Procedure Laterality Date  . OOPHORECTOMY  2003   for ovarian cysts--LEFT    Social History   Social History  . Marital status: Married    Spouse name: N/A  . Number of children: 2  . Years of education: N/A   Occupational History  . Not on file.   Social History Main Topics  . Smoking status: Never Smoker  . Smokeless tobacco: Never Used  . Alcohol use 0.0 oz/week     Comment: 0-2 drinks per month  . Drug use: No  . Sexual activity: Yes    Partners: Male    Birth control/ protection: Other-see comments     Comment: husband with vasectomy; also uses condoms "sometimes". Husband with HIV   Other Topics Concern  . Not on file   Social History Narrative   Lives with husband, 1 of 2 sons. Oldest lives in [place]. 4 stepchildren--stepdaughter is also living with them. Husband has HIV, and doing well on meds (diagnosed in 2011). 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 Heart disease Mother 54    CABG at 15  . Hyperlipidemia Mother   . Hypertension Mother   . Diabetes Mother   . Breast cancer Mother 50  . Cancer Mother 39    breast  . Arthritis Father   . Asthma Son   . Asthma Son   .  Depression Sister   . Hypertension Sister   . Diabetes Maternal Aunt   . Diabetes Maternal Uncle     Outpatient Encounter Prescriptions as of 08/04/2016  Medication Sig Note  . cholecalciferol (VITAMIN D) 1000 UNITS tablet Take 1,000 Units by mouth daily. Reported on 07/30/2015 06/03/2016: daily  . Evening Primrose Oil 1000 MG CAPS Take by mouth.   . Ginkgo Biloba (GNP GINGKO BILOBA EXTRACT PO) Take 1 capsule by mouth daily.   . rosuvastatin (CRESTOR) 10 MG tablet Take 1 tablet (10 mg total) by mouth daily.    No facility-administered encounter medications on file as of 08/04/2016.     No Known Allergies  ROS: The patient denies  anorexia, fever, headaches (just occasional), vision changes, decreased hearing, ear pain, sore throat, breast concerns, chest pain, palpitations, dizziness, syncope, dyspnea on exertion, cough, swelling, nausea, vomiting, diarrhea, constipation, abdominal pain, melena, hematochezia, indigestion/heartburn, hematuria, vaginal discharge, odor or itch, genital lesions, joint pains, numbness, tingling, weakness, tremor, suspicious skin lesions, depression, anxiety, abnormal bleeding/bruising, or enlarged lymph nodes. Occasional LBP (less often than in the past, infrequent). Seasonal allergies--haven't started yet. Irregular menses, per HPI. No further problems with reflux or wheezing, resolved. +slight ongoing weight gain, 5# over the last year   PHYSICAL EXAM:  BP 134/80 (BP Location: Left Arm, Patient Position: Sitting, Cuff Size: Normal)   Pulse 76   Ht 5' 8.5" (1.74 m)   Wt 214 lb 3.2 oz (97.2 kg)   LMP 07/25/2016 (Exact Date)   BMI 32.10 kg/m   Wt Readings from Last 3 Encounters:  06/03/16 213 lb 9.6 oz (96.9 kg)  07/30/15 211 lb 9.6 oz (96 kg)  06/19/15 209 lb (94.8 kg)   General Appearance:  Alert, cooperative, no distress, appears stated age   Head:  Normocephalic, without obvious abnormality, atraumatic   Eyes:  PERRL, conjunctiva/corneas clear, EOM's intact, fundi benign   Ears:  Normal TM's and external ear canals   Nose:  Nares normal, mucosa normal, no drainage or sinus tenderness   Throat:  Lips, mucosa, and tongue normal; teeth and gums normal   Neck:  Supple, no lymphadenopathy; thyroid: no enlargement/tenderness/nodules; no carotid bruit or JVD   Back:  Spine nontender, no curvature, ROM normal, no CVA tenderness   Lungs:  Clear to auscultation bilaterally without wheezes, rales or ronchi; respirations unlabored   Chest Wall:  No tenderness or deformity   Heart:  Regular rate and rhythm, S1 and S2 normal, no murmur, rub or gallop    Breast Exam:  No tenderness, masses, or nipple discharge or inversion. No axillary lymphadenopathy.   Abdomen:  Soft, non-tender, nondistended, normoactive bowel sounds,  no masses, no hepatosplenomegaly   Genitalia:  Normal external genitalia without lesions. BUS and vagina normal; no cervical lesions or cervical motion tenderness. +Nabothian cysts. Uterus and adnexa not enlarged, nontender, no masses. Pap performed   Rectal:  Normal sphincter tone; no masses; heme positive light brown stool  Extremities:  No clubbing, cyanosis or edema   Pulses:  2+ and symmetric all extremities   Skin:  Skin color, texture, turgor normal, no rashes or lesions   Lymph nodes:  Cervical, supraclavicular, and axillary nodes normal   Neurologic:  CNII-XII intact, normal strength, sensation and gait; reflexes 2+ and symmetric in upper extremities, 1+ and symmetric in lower extremities    Psych:  Normal mood, affect, hygiene and grooming    ASSESSMENT/PLAN:  Annual physical exam - Plan: POCT Urinalysis Dipstick, Lipid panel, Comprehensive metabolic  panel, CBC with Differential/Platelet, VITAMIN D 25 Hydroxy (Vit-D Deficiency, Fractures), TSH, HIV antibody, Cytology - PAP [place]  Pure hypercholesterolemia - Plan: Lipid panel  Vitamin D deficiency - Plan: VITAMIN D 25 Hydroxy (Vit-D Deficiency, Fractures)  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 Encouraged to re-consider Truvada for PrEP - Plan: HIV antibody  Unusually frequent menses - Plan: CBC with Differential/Platelet, TSH  Gastroesophageal reflux disease, esophagitis presence not specified - improved with dietary changes, not currently requiring meds. Discussed prn use of PPI prior to meals/foods which would trigger sx  Obesity (BMI 30.0-34.9) - counseled re: risks, diet, exercise  Medication monitoring encounter - Plan: Lipid panel, Comprehensive metabolic  panel, VITAMIN D 25 Hydroxy (Vit-D Deficiency, Fractures)   c-met, lipid, vit D, CBC, HIV, TSH Pap  Has a refill left on her Crestor, not needed today.   Discussed monthly self breast exams and yearly mammograms after the age of 52 (past due--encouraged to schedule 3D mammo, especially since mother recently diagnosed with breast cancer); at least 30 minutes of aerobic activity at least 5 days/week, add weight bearing exercise 2-3 times/week; proper sunscreen use reviewed; healthy diet, including goals of calcium and vitamin D intake and alcohol recommendations (less than or equal to 1 drink/day) reviewed; regular seatbelt use; changing batteries in smoke detectors. Immunization recommendations discussed--continue yearly flu shots. Colonoscopy recommendations reviewed--age 89, sooner prn   hemasure kit given, to send in sample in a week or two, when no symptoms or spotting.  If heme + stool persists, will need further eval.

## 2016-08-04 ENCOUNTER — Ambulatory Visit (INDEPENDENT_AMBULATORY_CARE_PROVIDER_SITE_OTHER): Payer: BLUE CROSS/BLUE SHIELD | Admitting: Family Medicine

## 2016-08-04 ENCOUNTER — Encounter: Payer: Self-pay | Admitting: Family Medicine

## 2016-08-04 ENCOUNTER — Other Ambulatory Visit (HOSPITAL_COMMUNITY)
Admission: RE | Admit: 2016-08-04 | Discharge: 2016-08-04 | Disposition: A | Discharge status: Home / Self Care | Payer: BLUE CROSS/BLUE SHIELD | Source: Ambulatory Visit | Attending: Family Medicine | Admitting: Family Medicine

## 2016-08-04 VITALS — BP 134/80 | HR 76 | Ht 68.5 in | Wt 214.2 lb

## 2016-08-04 DIAGNOSIS — N920 Excessive and frequent menstruation with regular cycle: Secondary | ICD-10-CM

## 2016-08-04 DIAGNOSIS — E669 Obesity, unspecified: Secondary | ICD-10-CM | POA: Diagnosis not present

## 2016-08-04 DIAGNOSIS — Z0000 Encounter for general adult medical examination without abnormal findings: Secondary | ICD-10-CM

## 2016-08-04 DIAGNOSIS — Z206 Contact with and (suspected) exposure to human immunodeficiency virus [HIV]: Secondary | ICD-10-CM | POA: Diagnosis not present

## 2016-08-04 DIAGNOSIS — E559 Vitamin D deficiency, unspecified: Secondary | ICD-10-CM

## 2016-08-04 DIAGNOSIS — Z01419 Encounter for gynecological examination (general) (routine) without abnormal findings: Secondary | ICD-10-CM | POA: Insufficient documentation

## 2016-08-04 DIAGNOSIS — Z1151 Encounter for screening for human papillomavirus (HPV): Secondary | ICD-10-CM | POA: Insufficient documentation

## 2016-08-04 DIAGNOSIS — Z5181 Encounter for therapeutic drug level monitoring: Secondary | ICD-10-CM | POA: Diagnosis not present

## 2016-08-04 DIAGNOSIS — K219 Gastro-esophageal reflux disease without esophagitis: Secondary | ICD-10-CM | POA: Diagnosis not present

## 2016-08-04 DIAGNOSIS — E7800 Pure hypercholesterolemia, unspecified: Secondary | ICD-10-CM

## 2016-08-04 LAB — POCT URINALYSIS DIPSTICK
Bilirubin, UA: NEGATIVE
Blood, UA: NEGATIVE
Glucose, UA: NEGATIVE
KETONES UA: NEGATIVE
Leukocytes, UA: NEGATIVE
Nitrite, UA: NEGATIVE
PH UA: 6 (ref 5.0–8.0)
PROTEIN UA: NEGATIVE
Spec Grav, UA: 1.02 (ref 1.030–1.035)
Urobilinogen, UA: NEGATIVE (ref ?–2.0)

## 2016-08-04 LAB — LIPID PANEL
Cholesterol: 174 mg/dL (ref <200)
HDL: 51 mg/dL (ref >50)
LDL Cholesterol: 111 mg/dL — ABNORMAL HIGH (ref <100)
Total CHOL/HDL Ratio: 3.4 Ratio (ref <5.0)
Triglycerides: 61 mg/dL (ref <150)
VLDL: 12 mg/dL (ref <30)

## 2016-08-04 LAB — CBC WITH DIFFERENTIAL/PLATELET
BASOS ABS: 57 {cells}/uL (ref 0–200)
Basophils Relative: 1 %
EOS PCT: 2 %
Eosinophils Absolute: 114 cells/uL (ref 15–500)
HCT: 38.7 % (ref 35.0–45.0)
Hemoglobin: 12.6 g/dL (ref 11.7–15.5)
LYMPHS PCT: 38 %
Lymphs Abs: 2166 cells/uL (ref 850–3900)
MCH: 27.9 pg (ref 27.0–33.0)
MCHC: 32.6 g/dL (ref 32.0–36.0)
MCV: 85.8 fL (ref 80.0–100.0)
MONOS PCT: 7 %
MPV: 10.7 fL (ref 7.5–12.5)
Monocytes Absolute: 399 cells/uL (ref 200–950)
Neutro Abs: 2964 cells/uL (ref 1500–7800)
Neutrophils Relative %: 52 %
PLATELETS: 326 10*3/uL (ref 140–400)
RBC: 4.51 MIL/uL (ref 3.80–5.10)
RDW: 13.5 % (ref 11.0–15.0)
WBC: 5.7 10*3/uL (ref 4.0–10.5)

## 2016-08-04 LAB — COMPREHENSIVE METABOLIC PANEL
ALBUMIN: 4 g/dL (ref 3.6–5.1)
ALK PHOS: 73 U/L (ref 33–115)
ALT: 15 U/L (ref 6–29)
AST: 16 U/L (ref 10–30)
BILIRUBIN TOTAL: 0.4 mg/dL (ref 0.2–1.2)
BUN: 13 mg/dL (ref 7–25)
CALCIUM: 9.4 mg/dL (ref 8.6–10.2)
CO2: 26 mmol/L (ref 20–31)
CREATININE: 0.95 mg/dL (ref 0.50–1.10)
Chloride: 104 mmol/L (ref 98–110)
Glucose, Bld: 101 mg/dL — ABNORMAL HIGH (ref 65–99)
Potassium: 4.5 mmol/L (ref 3.5–5.3)
SODIUM: 138 mmol/L (ref 135–146)
Total Protein: 6.9 g/dL (ref 6.1–8.1)

## 2016-08-04 NOTE — Patient Instructions (Addendum)
  HEALTH MAINTENANCE RECOMMENDATIONS:  It is recommended that you get at least 30 minutes of aerobic exercise at least 5 days/week (for weight loss, you may need as much as 60-90 minutes). This can be any activity that gets your heart rate up. This can be divided in 10-15 minute intervals if needed, but try and build up your endurance at least once a week.  Weight bearing exercise is also recommended twice weekly.  Eat a healthy diet with lots of vegetables, fruits and fiber.  "Colorful" foods have a lot of vitamins (ie green vegetables, tomatoes, red peppers, etc).  Limit sweet tea, regular sodas and alcoholic beverages, all of which has a lot of calories and sugar.  Up to 1 alcoholic drink daily may be beneficial for women (unless trying to lose weight, watch sugars).  Drink a lot of water.  Calcium recommendations are 1200-1500 mg daily (1500 mg for postmenopausal women or women without ovaries), and vitamin D 1000 IU daily.  This should be obtained from diet and/or supplements (vitamins), and calcium should not be taken all at once, but in divided doses.  Monthly self breast exams and yearly mammograms for women over the age of 43 is recommended.  Sunscreen of at least SPF 30 should be used on all sun-exposed parts of the skin when outside between the hours of 10 am and 4 pm (not just when at beach or pool, but even with exercise, golf, tennis, and yard work!)  Use a sunscreen that says "broad spectrum" so it covers both UVA and UVB rays, and make sure to reapply every 1-2 hours.  Remember to change the batteries in your smoke detectors when changing your clock times in the spring and fall.  Use your seat belt every time you are in a car, and please drive safely and not be distracted with cell phones and texting while driving.  Please use condoms 100% of the time. And consider using Truvada for pre-exposure prophylaxis (PrEP).  Let me know if you are interested.

## 2016-08-05 LAB — HIV ANTIBODY (ROUTINE TESTING W REFLEX): HIV 1&2 Ab, 4th Generation: NONREACTIVE

## 2016-08-05 LAB — VITAMIN D 25 HYDROXY (VIT D DEFICIENCY, FRACTURES): VIT D 25 HYDROXY: 31 ng/mL (ref 30–100)

## 2016-08-05 LAB — TSH: TSH: 1.46 mIU/L

## 2016-08-06 LAB — CYTOLOGY - PAP
DIAGNOSIS: NEGATIVE
HPV (WINDOPATH): NOT DETECTED

## 2016-09-21 ENCOUNTER — Other Ambulatory Visit: Payer: Self-pay | Admitting: Family Medicine

## 2016-09-21 DIAGNOSIS — Z0000 Encounter for general adult medical examination without abnormal findings: Secondary | ICD-10-CM

## 2016-09-21 DIAGNOSIS — E7800 Pure hypercholesterolemia, unspecified: Secondary | ICD-10-CM

## 2016-12-14 ENCOUNTER — Ambulatory Visit (INDEPENDENT_AMBULATORY_CARE_PROVIDER_SITE_OTHER): Payer: BLUE CROSS/BLUE SHIELD | Admitting: Family Medicine

## 2016-12-14 ENCOUNTER — Encounter: Payer: Self-pay | Admitting: Family Medicine

## 2016-12-14 ENCOUNTER — Ambulatory Visit
Admission: RE | Admit: 2016-12-14 | Discharge: 2016-12-14 | Disposition: A | Discharge status: Home / Self Care | Payer: BLUE CROSS/BLUE SHIELD | Source: Ambulatory Visit | Attending: Family Medicine | Admitting: Family Medicine

## 2016-12-14 ENCOUNTER — Other Ambulatory Visit: Payer: Self-pay

## 2016-12-14 ENCOUNTER — Other Ambulatory Visit: Payer: Self-pay | Admitting: Family Medicine

## 2016-12-14 VITALS — BP 122/80 | HR 102 | Wt 203.0 lb

## 2016-12-14 DIAGNOSIS — M25552 Pain in left hip: Secondary | ICD-10-CM | POA: Diagnosis not present

## 2016-12-14 DIAGNOSIS — Z1231 Encounter for screening mammogram for malignant neoplasm of breast: Secondary | ICD-10-CM

## 2016-12-14 DIAGNOSIS — R9389 Abnormal findings on diagnostic imaging of other specified body structures: Secondary | ICD-10-CM

## 2016-12-14 NOTE — Patient Instructions (Signed)
Take 4 ibuprofen 3 times per day 

## 2016-12-14 NOTE — Progress Notes (Signed)
   Subjective:    Patient ID: Chelsea Gould, female    DOB: 16-Nov-1975, 41 y.o.   MRN: [id number]  HPI She states that she woke up Saturday feeling fine, went out to eat and what she perceives to get up by abducting her leg she noted left hip and leg discomfort. That pain has continued. She has been using small amounts of ibuprofen for that. No other joints are involved. She has not had a mammogram yet. Presently she is only taking Crestor.   Review of Systems     Objective:   Physical Exam Alert and in no distress. Motion of the left hip in any direction causes discomfort.       Assessment & Plan:  Left hip pain - Plan: DG HIP UNILAT WITH PELVIS 2-3 VIEWS LEFT The x-rays do show some sclerotic changes and I will therefore have her come back for some blood work. Recommend she get her mammogram and follow-up based on that.

## 2016-12-15 ENCOUNTER — Other Ambulatory Visit: Payer: BLUE CROSS/BLUE SHIELD

## 2016-12-15 DIAGNOSIS — M25552 Pain in left hip: Secondary | ICD-10-CM

## 2016-12-15 DIAGNOSIS — R9389 Abnormal findings on diagnostic imaging of other specified body structures: Secondary | ICD-10-CM

## 2016-12-15 LAB — CBC
HEMATOCRIT: 38 % (ref 35.0–45.0)
HEMOGLOBIN: 12 g/dL (ref 11.7–15.5)
MCH: 27.5 pg (ref 27.0–33.0)
MCHC: 31.6 g/dL — AB (ref 32.0–36.0)
MCV: 87 fL (ref 80.0–100.0)
MPV: 10.6 fL (ref 7.5–12.5)
Platelets: 343 10*3/uL (ref 140–400)
RBC: 4.37 MIL/uL (ref 3.80–5.10)
RDW: 14 % (ref 11.0–15.0)
WBC: 4.6 10*3/uL (ref 4.0–10.5)

## 2016-12-16 LAB — COMPREHENSIVE METABOLIC PANEL
ALBUMIN: 4 g/dL (ref 3.6–5.1)
ALT: 13 U/L (ref 6–29)
AST: 16 U/L (ref 10–30)
Alkaline Phosphatase: 69 U/L (ref 33–115)
BUN: 15 mg/dL (ref 7–25)
CALCIUM: 9.2 mg/dL (ref 8.6–10.2)
CHLORIDE: 108 mmol/L (ref 98–110)
CO2: 20 mmol/L (ref 20–32)
Creat: 1.06 mg/dL (ref 0.50–1.10)
GLUCOSE: 93 mg/dL (ref 65–99)
Potassium: 4.3 mmol/L (ref 3.5–5.3)
Sodium: 139 mmol/L (ref 135–146)
Total Bilirubin: 0.4 mg/dL (ref 0.2–1.2)
Total Protein: 6.7 g/dL (ref 6.1–8.1)

## 2016-12-17 ENCOUNTER — Ambulatory Visit
Admission: RE | Admit: 2016-12-17 | Discharge: 2016-12-17 | Disposition: A | Discharge status: Home / Self Care | Payer: BLUE CROSS/BLUE SHIELD | Source: Ambulatory Visit | Attending: Family Medicine | Admitting: Family Medicine

## 2016-12-17 DIAGNOSIS — Z1231 Encounter for screening mammogram for malignant neoplasm of breast: Secondary | ICD-10-CM

## 2016-12-28 NOTE — Progress Notes (Signed)
[facility] [address] [place], [place] [address] Phone: [phone] Subjective:    I'm seeing this patient by the request  of:    CC: Left hip pain  UKG:[id number]  Chelsea Gould is a 41 y.o. female coming in with complaint of left hip pain. Happen when sitting, + groin pain patient had difficult he walking for disease. This is now 2 weeks ago. Has been taking ibuprofen with some mild improvement. Seems to be worse after standing for long amount of time. Sometimes can have a cramping sensation on the medial aspect. Patient's on primary care provider and did get x-rays.     patient did have x-rays of the hip done on 12/16/2016. X-rays were independently visualized by me. Patient does have some mixed sclerotic lesions noted of the superior portion of the left femoral neck well circumscribed. 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   Patient states overall it seems to be improving slowly but continues to have sometimes a sharp pain in the medial aspect. Patient denies any radiation down the leg.  Past Medical History:  Diagnosis Date  . Asthma    childhood  . GERD (gastroesophageal reflux disease)   . Pure hypercholesterolemia    Past Surgical History:  Procedure Laterality Date  . OOPHORECTOMY  2003   for ovarian cysts--LEFT   Social History   Social History  . Marital status: Married    Spouse name: N/A  . Number of children: 2  . Years of education: N/A   Social History Main Topics  . Smoking status: Never Smoker  . Smokeless tobacco: Never Used  . Alcohol use 0.0 oz/week     Comment: 0-2 drinks per month  . Drug use: No  . Sexual activity: Yes    Partners: Male    Birth control/ protection: Other-see comments     Comment: husband with vasectomy; also uses condoms "sometimes". Husband with HIV   Other Topics Concern  . Not on file   Social History Narrative   Lives with husband, 1 of 2 sons. Oldest lives in [place]. 4 stepchildren--stepdaughter is  also living with them. Husband has HIV, and doing well on meds (diagnosed in 2011). 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 Heart disease Mother 63       CABG at 54  . Hyperlipidemia Mother   . Hypertension Mother   . Diabetes Mother   . Breast cancer Mother 71  . Cancer Mother 55       breast  . Arthritis Father   . Asthma Son   . Asthma Son   . Depression Sister   . Hypertension Sister   . Diabetes Maternal Aunt   . Diabetes Maternal Uncle      Past medical history, social, surgical and family history all reviewed in electronic medical record.  No pertanent information unless stated regarding to the chief complaint.   Review of Systems:Review of systems updated and as accurate as of 12/28/16  No headache, visual changes, nausea, vomiting, diarrhea, constipation, dizziness, abdominal pain, skin rash, fevers, chills, night sweats, weight loss, swollen lymph nodes, body aches, joint swelling, muscle aches, chest pain, shortness of breath, mood changes.   Objective  Last menstrual period 12/06/2016. Systems examined below as of 12/28/16   General: No apparent distress alert and oriented  x3 mood and affect normal, dressed appropriately.  HEENT: Pupils equal, extraocular movements intact  Respiratory: Patient's speak in full sentences and does not appear short of breath  Cardiovascular: No lower extremity edema, non tender, no erythema  Skin: Warm dry intact with no signs of infection or rash on extremities or on axial skeleton.  Abdomen: Soft nontender  Neuro: Cranial nerves II through XII are intact, neurovascularly intact in all extremities with 2+ DTRs and 2+ pulses.  Lymph: No lymphadenopathy of posterior or anterior cervical chain or axillae bilaterally.  Gait normal with good balance and coordination.  MSK:  Non  tender with full range of motion and good stability and symmetric strength and tone of shoulders, elbows, wrist,  knee and ankles bilaterally.  ZOX:WRUE ROM IR: 5 Deg mild pain, ER: 25 Deg with moderate pain, Flexion: 120 Deg, Extension: 100 Deg, Abduction: 45 Deg, Adduction: 45 Deg Strength IR: 5/5, ER: 5/5, Flexion: 5/5, Extension: 5/5, Abduction: 5/5, Adduction: 5/5 Pelvic alignment unremarkable to inspection and palpation. Standing hip rotation and gait without trendelenburg sign / unsteadiness. Greater trochanter without tenderness to palpation. tenderness over piriformis and greater trochanter with general as compared to the contralateral side Positive pain with internal range of motion. No SI joint tenderness and normal minimal SI movement  MSK US performed of: Left This study was ordered, performed, and interpreted by Terrilee Files D.O.  Hip: Trochanteric bursa without swelling or effusion. Patient labrum does have significant calcific changes on the anterior aspect. Likely seems to be chronic Femoral neck appears unremarkable Patient does have what appears to be a adductor muscle strain with hypoechoic changes and mild increasing in Doppler flow. This seems to be right at the musculotendinous juncture.  IMPRESSION: Adductor tear, and chronic labral pathology   Impression and Recommendations:     This case required medical decision making of moderate complexity.      Note: This dictation was prepared with Dragon dictation along with smaller phrase technology. Any transcriptional errors that result from this process are unintentional.

## 2016-12-29 ENCOUNTER — Encounter: Payer: Self-pay | Admitting: Family Medicine

## 2016-12-29 ENCOUNTER — Ambulatory Visit (INDEPENDENT_AMBULATORY_CARE_PROVIDER_SITE_OTHER): Payer: BLUE CROSS/BLUE SHIELD | Admitting: Family Medicine

## 2016-12-29 ENCOUNTER — Ambulatory Visit: Payer: Self-pay

## 2016-12-29 VITALS — BP 124/84 | HR 63 | Ht 68.5 in | Wt 203.0 lb

## 2016-12-29 DIAGNOSIS — S76202A Unspecified injury of adductor muscle, fascia and tendon of left thigh, initial encounter: Secondary | ICD-10-CM | POA: Diagnosis not present

## 2016-12-29 DIAGNOSIS — M25552 Pain in left hip: Secondary | ICD-10-CM

## 2016-12-29 MED ORDER — VITAMIN D (ERGOCALCIFEROL) 1.25 MG (50000 UNIT) PO CAPS: 50000.0000 [IU] | ORAL_CAPSULE | ORAL | 0 refills | Status: DC

## 2016-12-29 NOTE — Assessment & Plan Note (Signed)
Patient does have more of a calcific change noted of the anterior capsule. This could be secondary to more of a labral pathology that likely more of a chronic tear. Patient does have mild pain with internal range of motion but does have near full range of motion. X-rays do show some sclerotic changes that could correspond with this calcific change. We discussed with patient differential also includes an early avascular necrosis but patient seems more likely, muscle injury especially based on the subjective injury. We will continue to monitor but if no improvement or worsening symptoms I do feel that advance imaging could be warranted

## 2016-12-29 NOTE — Patient Instructions (Addendum)
Good to see you  We will try treating this as a muscle injury but do have a small labral tear with calcium but this appears old.  Ice 20 minutes 2 times daily. Usually after activity and before bed. Thigh compression sleeve with increase activity  Once weekly vitam in D for 12 weeks.  Avoid jumping or running but ok to walk slow See me again in 4 weeks.

## 2016-12-29 NOTE — Assessment & Plan Note (Signed)
I believe the patient's pain is more consistent with muscle injury of the adductor. We discussed with patient at great length. We discussed icing regimen, home exercises, which activities to do in which ones to avoid. We discussed compression sleeve. Patient does have the abnormality noted on x-ray. I feel that this is though secondary to probably the previous labral tear that patient has which was noted on ultrasound today. Patient wants to also do once weekly vitamin D to see if they can help with some of the mild calcific changes noted of the anterior capsule. Patient will follow-up with me again in 4 weeks.

## 2017-01-26 ENCOUNTER — Ambulatory Visit: Payer: BLUE CROSS/BLUE SHIELD | Admitting: Family Medicine

## 2017-02-07 ENCOUNTER — Ambulatory Visit (INDEPENDENT_AMBULATORY_CARE_PROVIDER_SITE_OTHER): Payer: BLUE CROSS/BLUE SHIELD | Admitting: Family Medicine

## 2017-02-07 ENCOUNTER — Encounter: Payer: Self-pay | Admitting: Family Medicine

## 2017-02-07 DIAGNOSIS — S76202D Unspecified injury of adductor muscle, fascia and tendon of left thigh, subsequent encounter: Secondary | ICD-10-CM | POA: Diagnosis not present

## 2017-02-07 NOTE — Assessment & Plan Note (Signed)
Patient is doing significantly better at this time. We discussed icing regimen and home exercises. Patient follow-up as needed

## 2017-02-07 NOTE — Patient Instructions (Signed)
Good to see you   

## 2017-02-07 NOTE — Progress Notes (Signed)
[facility] [address] [place], [place] [address] Phone: [phone] Subjective:     CC: Left hip pain follow-up  LKG:[id number]  Chelsea Gould is a 41 y.o. female coming in for follow-up for left hip pain. She said that the pain has completely resolved. Patient didn't have what appeared to be an adductor tear. Patient did have a sclerotic appearance of the hip on the last x-ray but there was a concern for more of a uterine fibroid. Patient is able to do daily activities. Doing the exercises intermittently.    Past Medical History:  Diagnosis Date  . Asthma    childhood  . GERD (gastroesophageal reflux disease)   . Pure hypercholesterolemia    Past Surgical History:  Procedure Laterality Date  . OOPHORECTOMY  2003   for ovarian cysts--LEFT   Social History   Social History  . Marital status: Married    Spouse name: N/A  . Number of children: 2  . Years of education: N/A   Social History Main Topics  . Smoking status: Never Smoker  . Smokeless tobacco: Never Used  . Alcohol use 0.0 oz/week     Comment: 0-2 drinks per month  . Drug use: No  . Sexual activity: Yes    Partners: Male    Birth control/ protection: Other-see comments     Comment: husband with vasectomy; also uses condoms "sometimes". Husband with HIV   Other Topics Concern  . None   Social History Narrative   Lives with husband, 1 of 2 sons. Oldest lives in [place]. 4 stepchildren--stepdaughter is also living with them. Husband has HIV, and doing well on meds (diagnosed in 2011). 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 Heart disease Mother 59       CABG at 104  . Hyperlipidemia Mother   . Hypertension Mother   . Diabetes Mother   . Breast cancer Mother 63  . Cancer Mother 69       breast  . Arthritis  Father   . Asthma Son   . Asthma Son   . Depression Sister   . Hypertension Sister   . Diabetes Maternal Aunt   . Diabetes Maternal Uncle      Past medical history, social, surgical and family history all reviewed in electronic medical record.  No pertanent information unless stated regarding to the chief complaint.   Review of Systems:Review of systems updated and as accurate as of 02/07/17  No headache, visual changes, nausea, vomiting, diarrhea, constipation, dizziness, abdominal pain, skin rash, fevers, chills, night sweats, weight loss, swollen lymph nodes, body aches, joint swelling, muscle aches, chest pain, shortness of breath, mood changes.   Objective  Blood pressure 118/90, pulse 77, height  (1.727 m), weight 204 lb (92.5 kg), SpO2 98 %. Systems examined below as of 02/07/17   General: No apparent distress alert and oriented x3 mood and affect normal, dressed appropriately.  HEENT: Pupils equal, extraocular movements intact  Respiratory: Patient's speak in full sentences and does not appear short of breath  Cardiovascular: No lower extremity edema, non tender, no erythema  Skin: Warm dry intact with no signs of infection or rash on extremities or on axial skeleton.  Abdomen: Soft nontender  Neuro: Cranial nerves II through XII are intact, neurovascularly intact in  all extremities with 2+ DTRs and 2+ pulses.  Lymph: No lymphadenopathy of posterior or anterior cervical chain or axillae bilaterally.  Gait normal with good balance and coordination.  MSK:  Non tender with full range of motion and good stability and symmetric strength and tone of shoulders, elbows, wrist,  knee and ankles bilaterally.  Hip: left  ROM IR: 25 Deg, ER: 35 Deg, Flexion: 100 Deg, Extension: 100 Deg, Abduction: 45 Deg, Adduction: 45 Deg Strength IR: 5/5, ER: 5/5, Flexion: 5/5, Extension: 5/5, Abduction: 4/5, Adduction: 5/5 Pelvic alignment unremarkable to inspection and palpation. Standing hip  rotation and gait without trendelenburg sign / unsteadiness. Greater trochanter without tenderness to palpation. No tenderness over piriformis and greater trochanter. No pain with FABER or FADIR. No SI joint tenderness and normal minimal SI movement. Contralateral    Impression and Recommendations:     This case required medical decision making of moderate complexity.      Note: This dictation was prepared with Dragon dictation along with smaller phrase technology. Any transcriptional errors that result from this process are unintentional.

## 2017-02-28 ENCOUNTER — Encounter: Payer: Self-pay | Admitting: Family Medicine

## 2017-03-01 ENCOUNTER — Encounter: Payer: Self-pay | Admitting: Medical

## 2017-03-01 ENCOUNTER — Ambulatory Visit (INDEPENDENT_AMBULATORY_CARE_PROVIDER_SITE_OTHER): Payer: BLUE CROSS/BLUE SHIELD | Admitting: Medical

## 2017-03-01 VITALS — BP 140/98 | HR 78 | Wt 203.2 lb

## 2017-03-01 DIAGNOSIS — Z23 Encounter for immunization: Secondary | ICD-10-CM

## 2017-03-01 DIAGNOSIS — E7800 Pure hypercholesterolemia, unspecified: Secondary | ICD-10-CM | POA: Diagnosis not present

## 2017-03-01 DIAGNOSIS — Z8249 Family history of ischemic heart disease and other diseases of the circulatory system: Secondary | ICD-10-CM | POA: Diagnosis not present

## 2017-03-01 DIAGNOSIS — R030 Elevated blood-pressure reading, without diagnosis of hypertension: Secondary | ICD-10-CM | POA: Diagnosis not present

## 2017-03-01 NOTE — Progress Notes (Signed)
Subjective: Chief Complaint  Patient presents with  . blood pressure running high    blood pressure running high x 2 week,some headache   Here for blood pressure concerns.  Went to the dentist recently, had anxiety about going to the dentist, BP was high.  [facility] other pharmacy cuff.   No prior diagnosis of HTN or medication for blood pressure.  At the dental office, BP was around 160/113.  At [facility] 197/107.  She does not have a cuff at home.    She also had it checked last night at her mother's doctor's office, and BP was 170/100.   She denies any recent changes in routine, diet or exercise to trigger elevation of BP.     Job is stressful, but no new stress. Been on this job 52mo.  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   No changes at home.    Not exercising regularly.   Was walking regularly in prior months.  Diet - more sandwiches of late.   Denies chest pain, SOB, edema, palpitations, no concern for sleep apnea, no sleep apnea symptoms.     Past Medical History:  Diagnosis Date  . Asthma    childhood  . GERD (gastroesophageal reflux disease)   . Pure hypercholesterolemia    Current Outpatient Prescriptions on File Prior to Visit  Medication Sig Dispense Refill  . cholecalciferol (VITAMIN D) 1000 UNITS tablet Take 1,000 Units by mouth daily. Reported on 07/30/2015    . Evening Primrose Oil 1000 MG CAPS Take by mouth.    . Ginkgo Biloba (GNP GINGKO BILOBA EXTRACT PO) Take 1 capsule by mouth daily.    . rosuvastatin (CRESTOR) 10 MG tablet TAKE ONE TABLET BY MOUTH ONCE DAILY 90 tablet 3   No current facility-administered medications on file prior to visit.    Family History  Problem Relation Age of Onset  . Heart disease Mother 6360       CABG at 2861  . Hyperlipidemia Mother   . Hypertension Mother   . Diabetes Mother   . Breast cancer Mother 6975  . Cancer Mother 2875       breast  . Arthritis Father   . Other Father        died of  internal bleeding  . Asthma Son   . Asthma Son   . Depression Sister   . Hypertension Sister   . Diabetes Maternal Aunt   . Diabetes Maternal Uncle     ROS as in subjective   Objective: BP (!) 140/98   Pulse 78   Wt 203 lb 3.2 oz (92.2 kg)   SpO2 98%   BMI 30.90 kg/m    BP Readings from Last 3 Encounters:  03/01/17 (!) 140/98  02/07/17 118/90  12/29/16 124/84   Wt Readings from Last 3 Encounters:  03/01/17 203 lb 3.2 oz (92.2 kg)  02/07/17 204 lb (92.5 kg)  12/29/16 203 lb (92.1 kg)    General appearance: alert, no distress, WD/WN, AA female Oral cavity: MMM, no lesions Neck: supple, no lymphadenopathy, no thyromegaly, no masses, no bruits Heart: RRR, normal S1, S2, no murmurs Lungs: CTA bilaterally, no wheezes, rhonchi, or rales Abdomen: +bs, soft, non tender, non distended, no masses, no hepatomegaly, no splenomegaly Extremities: no edema, no cyanosis, no clubbing Pulses: 2+ symmetric, upper and lower extremities, normal cap refill Neurological: alert, oriented x 3, CN2-12 intact, strength normal upper extremities and lower extremities, sensation normal throughout, DTRs  2+ throughout, no cerebellar signs, gait normal Psychiatric: normal affect, behavior normal, pleasant    Adult ECG Report  Indication: elevated BP  Rate: 74 bpm  Rhythm: normal sinus rhythm  QRS Axis: 40 degrees  PR Interval:  QRS Duration: 84ms  QTc:  Conduction Disturbances: none  Other Abnormalities: nonspecific T wave abnormality  Patient's cardiac risk factors are: dyslipidemia and obesity (BMI >= 30 kg/m2).  EKG comparison: none  Narrative Interpretation: nonspecific T wave abnormality     Assessment: Encounter Diagnoses  Name Primary?  . Elevated blood pressure reading Yes  . Pure hypercholesterolemia   . Family history of heart disease in female family member before age 82       Plan: Discussed her recent elevated readings, family history, hx/o hyperlipidemia,  obesity.  She will get back to her walking and exercise, we discussed diet recommendations, stress reduction, having balance in life.  She seems to be extended with responsibilities at work that is preventing other health behaviors.   She will work on these, monitor BPs, and get [place] BP readings in 2-3 weeks.  We will give her a period of time to work on lifestyle changes.   F/u in 62mo depending up on her BP readings she sends [place] soon.  Counseled on the influenza virus vaccine.  Vaccine information sheet given.  Influenza vaccine given after consent obtained.  Nickolette was seen today for blood pressure running high.  Diagnoses and all orders for this visit:  Elevated blood pressure reading  Pure hypercholesterolemia  Family history of heart disease in female family member before age 2

## 2017-08-09 NOTE — Progress Notes (Signed)
Chief Complaint  Patient presents with  . Annual Exam    fasting annual exam with pelvic. Will schedule eye exam with [facility]. No concerns. Has form to be filled.     Chelsea Gould is a 42 y.o. female who presents for a complete physical.  She has the following concerns:  She saw Audelia Acton back in October with elevated BP's, first noted at dentist.  BP's at home have been running 130-150/100-115 (once was up to 160's).  Earlier this week it was 131/102.  She forgot to bring her list today.  Hasn't seen diastolic under 485.  Hasn't had her monitor checked. Has had some intermittent headaches--sometimes sinus, some migraines.  Only in the last couple of weeks has she made changes in cutting back on her sodium. +fried foods, french fries, pickles, some canned vegetables. +turkey sausage, bacon. Only started walking again with the warmer weather, very recent.  She saw Dr. Redmond School in October with left hip pain. She had x-rays and was subsequently referred to Dr. Charlann Boxer, who performed ultrasound. She was diagnosed with adductor tear, and chronic labral pathology.  Her hip pain has resolved. CBC and c-met in August were normal.  In January 2018 she was diagnosed with reflux after complaining of nght-time wheezing and abdominal pain. Pain resolved after taking Prilosec for 2 weeks and modifying diet, and wheezing also resolved. She uses Gas-X prn.  Vitamin D deficiency:  Level was 23 in 2017. Last year it was normal, at 31, when taking 1000 IU daily. She continues to take 1000 IU every day.  Hyperlipidemia: on Crestor, takes each evening, and denies side effects.She is compliant with her medication. She is trying to following a lowfat, low cholesterol diet, admits to some poor choices related to houseguests (more bacon). No longer uses creamy dressings.  Eats 2 scrambled eggs 2x/week.  Eats red meat 2x/week. Last year: Lab Results  Component Value Date   CHOL 174 08/04/2016   HDL 51  08/04/2016   LDLCALC 111 (H) 08/04/2016   TRIG 61 08/04/2016   CHOLHDL 3.4 08/04/2016   Menses are mostly regular (skipped a month in the Fall, occasionally has 2 periods/month (around 16-18 days later, had 2 in March), not particularly heavy, just for 2-3 days, lasting overall about 6 days. No hot flashes or night sweats (had some in September, when she skipped a cycle).  Husband has HIV--she reports his counts are "good". Also reports they have been better about using condoms regularly.  Previously discussed PreP--wasn't interested due to the price. HIV negative 07/2016.   Immunization History  Administered Date(s) Administered  . Influenza Split 02/08/2011  . Influenza,inj,Quad PF,6+ Mos 05/17/2013, 12/31/2013, 06/03/2016, 03/01/2017  . Tdap 02/08/2011   Last Pap smear: 07/2016; negative, with no high risk HPV Last mammogram: 12/2016 normal Last colonoscopy: never  Last DEXA: never  Ophtho: 04/2016, every 2 years (has glasses) Dentist: regularly since last year, has appt today Exercise: walking 30 minutes 3x/week only over the last couple of weeks when the weather is nice.  Past Medical History:  Diagnosis Date  . Asthma    childhood  . GERD (gastroesophageal reflux disease)   . Pure hypercholesterolemia     Past Surgical History:  Procedure Laterality Date  . OOPHORECTOMY  2003   for ovarian cysts--LEFT    Social History   Socioeconomic History  . Marital status: Married    Spouse name: Not on file  . Number of children: 2  . Years  of education: Not on file  . Highest education level: Not on file  Occupational History  . Not on file  Social Needs  . Financial resource strain: Not on file  . Food insecurity:    Worry: Not on file    Inability: Not on file  . Transportation needs:    Medical: Not on file    Non-medical: Not on file  Tobacco Use  . Smoking status: Never Smoker  . Smokeless tobacco: Never Used  Substance and Sexual Activity  . Alcohol  use: Yes    Alcohol/week: 0.0 oz    Comment: 0-2 drinks per month  . Drug use: No  . Sexual activity: Yes    Partners: Male    Birth control/protection: Other-see comments, Condom    Comment: husband with vasectomy; also uses condoms "sometimes". Husband with HIV  Lifestyle  . Physical activity:    Days per week: Not on file    Minutes per session: Not on file  . Stress: Not on file  Relationships  . Social connections:    Talks on phone: Not on file    Gets together: Not on file    Attends religious service: Not on file    Active member of club or organization: Not on file    Attends meetings of clubs or organizations: Not on file    Relationship status: Not on file  . Intimate partner violence:    Fear of current or ex partner: Not on file    Emotionally abused: Not on file    Physically abused: Not on file    Forced sexual activity: Not on file  Other Topics Concern  . Not on file  Social History Narrative   Lives with husband, 2 sons (10, 1 in 08/2017)   Husband has HIV, and doing well on meds (diagnosed in 2011). 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 Heart disease Mother 61       CABG at 63  . Hyperlipidemia Mother   . Hypertension Mother   . Diabetes Mother   . Breast cancer Mother 15  . Cancer Mother 34       breast  . Arthritis Father   . Other Father        died of internal bleeding  . Asthma Son   . Asthma Son   . Depression Sister   . Hypertension Sister   . Diabetes Maternal Aunt   . Diabetes Maternal Uncle     Outpatient Encounter Medications as of 08/10/2017  Medication Sig Note  . b complex vitamins tablet Take 1 tablet by mouth daily.   . cholecalciferol (VITAMIN D) 1000 UNITS tablet Take 1,000 Units by mouth daily. Reported on 07/30/2015 06/03/2016: daily  . Evening Primrose Oil 1000 MG CAPS Take by  mouth.   . Ginkgo Biloba (GNP GINGKO BILOBA EXTRACT PO) Take 1 capsule by mouth daily.   . rosuvastatin (CRESTOR) 10 MG tablet TAKE ONE TABLET BY MOUTH ONCE DAILY   . cetirizine (ZYRTEC) 10 MG chewable tablet Chew 10 mg by mouth daily.    No facility-administered encounter medications on file as of 08/10/2017.     No Known Allergies   ROS: The patient denies anorexia, fever, vision changes, decreased hearing, ear pain, sore throat, breast concerns, chest pain, palpitations, dizziness, syncope, dyspnea on  exertion, cough, swelling, nausea, vomiting, diarrhea, constipation, abdominal pain, melena, hematochezia, indigestion/heartburn (infrequent), hematuria, vaginal discharge, odor or itch, genital lesions, joint pains, numbness, tingling, weakness, tremor, suspicious skin lesions, depression, anxiety, abnormal bleeding/bruising, or enlarged lymph nodes. Occasional LBP (less often than in the past, infrequent). Stretching helps. Seasonal allergies--haven't started yet. Intermittently having irregular menses, per HPI. No further problems with reflux or wheezing. Uses Gas-x once or twice a month. Regained weight she had lost, up 9# since October Intermittent headaches--sinus, tension (posterior), every few months gets more severe,she refers to "migraine".    PHYSICAL EXAM:  BP (!) 134/98   Pulse 80   Ht '5\' 8"'  (1.727 m)   Wt 212 lb (96.2 kg)   BMI 32.23 kg/m    154/92 on repeat by MD  Wt Readings from Last 3 Encounters:  08/10/17 212 lb (96.2 kg)  03/01/17 203 lb 3.2 oz (92.2 kg)  02/07/17 204 lb (92.5 kg)   Wt 214# 3.2oz at her physical 1 year ago.   General Appearance:  Alert, cooperative, no distress, appears stated age   Head:  Normocephalic, without obvious abnormality, atraumatic   Eyes:  PERRL, conjunctiva/corneas clear, EOM's intact, fundi benign   Ears:  Normal TM's and external ear canals   Nose:  Nares normal, mucosa normal, no drainage or sinus  tenderness   Throat:  Lips, mucosa, and tongue normal; teeth and gums normal   Neck:  Supple, no lymphadenopathy; thyroid: no enlargement/tenderness/ nodules; no carotid bruit or JVD   Back:  Spine nontender, no curvature, ROM normal, no CVA tenderness   Lungs:  Clear to auscultation bilaterally without wheezes, rales or ronchi; respirations unlabored   Chest Wall:  No tenderness or deformity   Heart:  Regular rate and rhythm, S1 and S2 normal, no murmur, rub or gallop   Breast Exam:  No tenderness, masses, or nipple discharge or inversion. No axillary lymphadenopathy.   Abdomen:  Soft, non-tender, nondistended, normoactive bowel sounds, no masses, no hepatosplenomegaly   Genitalia:  Normal external genitalia without lesions. BUS and vagina normal; no cervical motion tenderness. +Nabothian cysts palpable. Uterus and adnexa not enlarged, nontender, no masses. Pap not performed   Rectal:  Normal sphincter tone; no masses; heme negative stool  Extremities:  No clubbing, cyanosis or edema   Pulses:  2+ and symmetric all extremities   Skin:  Skin color, texture, turgor normal, no rashes or lesions. Seborrheic keratosis at 4 o'clock position of left breast  Lymph nodes:  Cervical, supraclavicular, and axillary nodes normal   Neurologic:  CNII-XII intact, normal strength, sensation and gait; reflexes 2+ and symmetric in upper extremities, 1+ and symmetric in lower extremities    Psych:  Normal mood, affect, hygiene and grooming   ASSESSMENT/PLAN:  Annual physical exam - Plan: POCT Urinalysis DIP (Proadvantage Device), HIV antibody, TSH, VITAMIN D 25 Hydroxy (Vit-D Deficiency, Fractures), Comprehensive metabolic panel, Lipid panel  Pure hypercholesterolemia - Plan: Lipid panel  Vitamin D deficiency - Plan: VITAMIN D 25 Hydroxy (Vit-D Deficiency, Fractures)  HIV exposure - reminded to continue regular condom use (vs  reconsider PreP) - Plan: HIV antibody  Medication monitoring encounter - Plan: VITAMIN D 25 Hydroxy (Vit-D Deficiency, Fractures), Comprehensive metabolic panel, Lipid panel  Weight gain - counseled re: diet, exercise, risks - Plan: TSH  Obesity (BMI 30.0-34.9)  Essential hypertension - new diagnosis; counseled re: low sodium diet, exercise, wt loss. Start HCTZ; risks/SE reviewed, K+ rich foods discussed - Plan: hydrochlorothiazide (HYDRODIURIL) 25 MG tablet  c-met, lipid, vit D, HIV, TSH   Start HCTZ at 1/2 tablet once daily.  Increase after a week if your blood pressure remains over 135/85. Goal blood pressure is under 130/80. Daily exercise, weight loss and low sodium diet will all help keep the blood pressure down. Record your blood pressures and bring your list and your monitor to your next visit.  Discussed monthly self breast exams and yearly mammograms after the age of 37 (past due--encouraged to schedule 3D mammo, especially since mother recently diagnosed with breast cancer); at least 30 minutes of aerobic activity at least 5 days/week, add weight bearing exercise 2-3 times/week; proper sunscreen use reviewed; healthy diet, including goals of calcium and vitamin D intake and alcohol recommendations (less than or equal to 1 drink/day) reviewed; regular seatbelt use; changing batteries in smoke detectors. Immunization recommendations discussed--continue yearly flu shots. Colonoscopy recommendations reviewed--age 1, sooner prn

## 2017-08-10 ENCOUNTER — Encounter: Payer: Self-pay | Admitting: Family Medicine

## 2017-08-10 ENCOUNTER — Ambulatory Visit (INDEPENDENT_AMBULATORY_CARE_PROVIDER_SITE_OTHER): Payer: BLUE CROSS/BLUE SHIELD | Admitting: Family Medicine

## 2017-08-10 VITALS — BP 134/98 | HR 80 | Ht 68.0 in | Wt 212.0 lb

## 2017-08-10 DIAGNOSIS — R635 Abnormal weight gain: Secondary | ICD-10-CM

## 2017-08-10 DIAGNOSIS — I10 Essential (primary) hypertension: Secondary | ICD-10-CM | POA: Diagnosis not present

## 2017-08-10 DIAGNOSIS — E559 Vitamin D deficiency, unspecified: Secondary | ICD-10-CM

## 2017-08-10 DIAGNOSIS — Z0000 Encounter for general adult medical examination without abnormal findings: Secondary | ICD-10-CM | POA: Diagnosis not present

## 2017-08-10 DIAGNOSIS — Z5181 Encounter for therapeutic drug level monitoring: Secondary | ICD-10-CM

## 2017-08-10 DIAGNOSIS — E669 Obesity, unspecified: Secondary | ICD-10-CM

## 2017-08-10 DIAGNOSIS — E7800 Pure hypercholesterolemia, unspecified: Secondary | ICD-10-CM

## 2017-08-10 DIAGNOSIS — Z206 Contact with and (suspected) exposure to human immunodeficiency virus [HIV]: Secondary | ICD-10-CM | POA: Diagnosis not present

## 2017-08-10 LAB — POCT URINALYSIS DIP (PROADVANTAGE DEVICE)
BILIRUBIN UA: NEGATIVE mg/dL
Bilirubin, UA: NEGATIVE
Blood, UA: NEGATIVE
Glucose, UA: NEGATIVE mg/dL
LEUKOCYTES UA: NEGATIVE
Nitrite, UA: NEGATIVE
Specific Gravity, Urine: 1.025
Urobilinogen, Ur: NEGATIVE
pH, UA: 6 (ref 5.0–8.0)

## 2017-08-10 MED ORDER — HYDROCHLOROTHIAZIDE 25 MG PO TABS: ORAL_TABLET | ORAL | 1 refills | Status: DC

## 2017-08-10 NOTE — Patient Instructions (Addendum)
HEALTH MAINTENANCE RECOMMENDATIONS:  It is recommended that you get at least 30 minutes of aerobic exercise at least 5 days/week (for weight loss, you may need as much as 60-90 minutes). This can be any activity that gets your heart rate up. This can be divided in 10-15 minute intervals if needed, but try and build up your endurance at least once a week.  Weight bearing exercise is also recommended twice weekly.  Eat a healthy diet with lots of vegetables, fruits and fiber.  "Colorful" foods have a lot of vitamins (ie green vegetables, tomatoes, red peppers, etc).  Limit sweet tea, regular sodas and alcoholic beverages, all of which has a lot of calories and sugar.  Up to 1 alcoholic drink daily may be beneficial for women (unless trying to lose weight, watch sugars).  Drink a lot of water.  Calcium recommendations are 1200-1500 mg daily (1500 mg for postmenopausal women or women without ovaries), and vitamin D 1000 IU daily.  This should be obtained from diet and/or supplements (vitamins), and calcium should not be taken all at once, but in divided doses.  Monthly self breast exams and yearly mammograms for women over the age of 44 is recommended.  Sunscreen of at least SPF 30 should be used on all sun-exposed parts of the skin when outside between the hours of 10 am and 4 pm (not just when at beach or pool, but even with exercise, golf, tennis, and yard work!)  Use a sunscreen that says "broad spectrum" so it covers both UVA and UVB rays, and make sure to reapply every 1-2 hours.  Remember to change the batteries in your smoke detectors when changing your clock times in the spring and fall.  Use your seat belt every time you are in a car, and please drive safely and not be distracted with cell phones and texting while driving.  Start HCTZ at 1/2 tablet once daily.  Increase after a week if your blood pressure remains over 135/85. Goal blood pressure is under 130/80. Daily exercise, weight loss  and low sodium diet will all help keep the blood pressure down. Record your blood pressures and bring your list and your monitor to your next visit. Try and get some dietary potassium as we discussed (ie bananas).   Low-Sodium Eating Plan Sodium, which is an element that makes up salt, helps you maintain a healthy balance of fluids in your body. Too much sodium can increase your blood pressure and cause fluid and waste to be held in your body. Your health care provider or dietitian may recommend following this plan if you have high blood pressure (hypertension), kidney disease, liver disease, or heart failure. Eating less sodium can help lower your blood pressure, reduce swelling, and protect your heart, liver, and kidneys. What are tips for following this plan? General guidelines  Most people on this plan should limit their sodium intake to 1,500-2,000 mg (milligrams) of sodium each day. Reading food labels  The Nutrition Facts label lists the amount of sodium in one serving of the food. If you eat more than one serving, you must multiply the listed amount of sodium by the number of servings.  Choose foods with less than 140 mg of sodium per serving.  Avoid foods with 300 mg of sodium or more per serving. Shopping  Look for lower-sodium products, often labeled as "low-sodium" or "no salt added."  Always check the sodium content even if foods are labeled as "unsalted" or "no salt added".  Buy fresh foods. ? Avoid canned foods and premade or frozen meals. ? Avoid canned, cured, or processed meats  Buy breads that have less than 80 mg of sodium per slice. Cooking  Eat more home-cooked food and less restaurant, buffet, and fast food.  Avoid adding salt when cooking. Use salt-free seasonings or herbs instead of table salt or sea salt. Check with your health care provider or pharmacist before using salt substitutes.  Cook with plant-based oils, such as canola, sunflower, or olive  oil. Meal planning  When eating at a restaurant, ask that your food be prepared with less salt or no salt, if possible.  Avoid foods that contain MSG (monosodium glutamate). MSG is sometimes added to [place] food, bouillon, and some canned foods. What foods are recommended? The items listed may not be a complete list. Talk with your dietitian about what dietary choices are best for you. Grains Low-sodium cereals, including oats, puffed wheat and rice, and shredded wheat. Low-sodium crackers. Unsalted rice. Unsalted pasta. Low-sodium bread. Whole-grain breads and whole-grain pasta. Vegetables Fresh or frozen vegetables. "No salt added" canned vegetables. "No salt added" tomato sauce and paste. Low-sodium or reduced-sodium tomato and vegetable juice. Fruits Fresh, frozen, or canned fruit. Fruit juice. Meats and other protein foods Fresh or frozen (no salt added) meat, poultry, seafood, and fish. Low-sodium canned tuna and salmon. Unsalted nuts. Dried peas, beans, and lentils without added salt. Unsalted canned beans. Eggs. Unsalted nut butters. Dairy Milk. Soy milk. Cheese that is naturally low in sodium, such as ricotta cheese, fresh mozzarella, or Swiss cheese Low-sodium or reduced-sodium cheese. Cream cheese. Yogurt. Fats and oils Unsalted butter. Unsalted margarine with no trans fat. Vegetable oils such as canola or olive oils. Seasonings and other foods Fresh and dried herbs and spices. Salt-free seasonings. Low-sodium mustard and ketchup. Sodium-free salad dressing. Sodium-free light mayonnaise. Fresh or refrigerated horseradish. Lemon juice. Vinegar. Homemade, reduced-sodium, or low-sodium soups. Unsalted popcorn and pretzels. Low-salt or salt-free chips. What foods are not recommended? The items listed may not be a complete list. Talk with your dietitian about what dietary choices are best for you. Grains Instant hot cereals. Bread stuffing, pancake, and biscuit mixes. Croutons.  Seasoned rice or pasta mixes. Noodle soup cups. Boxed or frozen macaroni and cheese. Regular salted crackers. Self-rising flour. Vegetables Sauerkraut, pickled vegetables, and relishes. Olives. [place] fries. Onion rings. Regular canned vegetables (not low-sodium or reduced-sodium). Regular canned tomato sauce and paste (not low-sodium or reduced-sodium). Regular tomato and vegetable juice (not low-sodium or reduced-sodium). Frozen vegetables in sauces. Meats and other protein foods Meat or fish that is salted, canned, smoked, spiced, or pickled. Bacon, ham, sausage, hotdogs, corned beef, chipped beef, packaged lunch meats, salt pork, jerky, pickled herring, anchovies, regular canned tuna, sardines, salted nuts. Dairy Processed cheese and cheese spreads. Cheese curds. Blue cheese. Feta cheese. String cheese. Regular cottage cheese. Buttermilk. Canned milk. Fats and oils Salted butter. Regular margarine. Ghee. Bacon fat. Seasonings and other foods Onion salt, garlic salt, seasoned salt, table salt, and sea salt. Canned and packaged gravies. Worcestershire sauce. Tartar sauce. Barbecue sauce. Teriyaki sauce. Soy sauce, including reduced-sodium. Steak sauce. Fish sauce. Oyster sauce. Cocktail sauce. Horseradish that you find on the shelf. Regular ketchup and mustard. Meat flavorings and tenderizers. Bouillon cubes. Hot sauce and Tabasco sauce. Premade or packaged marinades. Premade or packaged taco seasonings. Relishes. Regular salad dressings. Salsa. Potato and tortilla chips. Corn chips and puffs. Salted popcorn and pretzels. Canned or dried soups. Pizza. Frozen entrees and  pot pies. Summary  Eating less sodium can help lower your blood pressure, reduce swelling, and protect your heart, liver, and kidneys.  Most people on this plan should limit their sodium intake to 1,500-2,000 mg (milligrams) of sodium each day.  Canned, boxed, and frozen foods are high in sodium. Restaurant foods, fast foods, and  pizza are also very high in sodium. You also get sodium by adding salt to food.  Try to cook at home, eat more fresh fruits and vegetables, and eat less fast food, canned, processed, or prepared foods. This information is not intended to replace advice given to you by your health care provider. Make sure you discuss any questions you have with your health care provider. Document Released: 10/16/2001 Document Revised: 04/19/2016 Document Reviewed: 04/19/2016 Elsevier Interactive Patient Education  [employer].

## 2017-08-11 LAB — COMPREHENSIVE METABOLIC PANEL
ALT: 20 IU/L (ref 0–32)
AST: 24 IU/L (ref 0–40)
Albumin/Globulin Ratio: 1.7 (ref 1.2–2.2)
Albumin: 4.4 g/dL (ref 3.5–5.5)
Alkaline Phosphatase: 84 IU/L (ref 39–117)
BILIRUBIN TOTAL: 0.3 mg/dL (ref 0.0–1.2)
BUN/Creatinine Ratio: 13 (ref 9–23)
BUN: 12 mg/dL (ref 6–24)
CHLORIDE: 102 mmol/L (ref 96–106)
CO2: 24 mmol/L (ref 20–29)
Calcium: 9.4 mg/dL (ref 8.7–10.2)
Creatinine, Ser: 0.89 mg/dL (ref 0.57–1.00)
GFR calc Af Amer: 92 mL/min/{1.73_m2} (ref >59)
GFR calc non Af Amer: 80 mL/min/{1.73_m2} (ref >59)
GLUCOSE: 85 mg/dL (ref 65–99)
Globulin, Total: 2.6 g/dL (ref 1.5–4.5)
POTASSIUM: 4.5 mmol/L (ref 3.5–5.2)
Sodium: 139 mmol/L (ref 134–144)
Total Protein: 7 g/dL (ref 6.0–8.5)

## 2017-08-11 LAB — LIPID PANEL
Chol/HDL Ratio: 3 ratio (ref 0.0–4.4)
Cholesterol, Total: 149 mg/dL (ref 100–199)
HDL: 50 mg/dL (ref >39)
LDL Calculated: 88 mg/dL (ref 0–99)
TRIGLYCERIDES: 54 mg/dL (ref 0–149)
VLDL CHOLESTEROL CAL: 11 mg/dL (ref 5–40)

## 2017-08-11 LAB — VITAMIN D 25 HYDROXY (VIT D DEFICIENCY, FRACTURES): VIT D 25 HYDROXY: 49.6 ng/mL (ref 30.0–100.0)

## 2017-08-11 LAB — TSH: TSH: 1.13 u[IU]/mL (ref 0.450–4.500)

## 2017-08-11 LAB — HIV ANTIBODY (ROUTINE TESTING W REFLEX): HIV Screen 4th Generation wRfx: NONREACTIVE

## 2017-08-11 MED ORDER — ROSUVASTATIN CALCIUM 10 MG PO TABS: 10.0000 mg | ORAL_TABLET | Freq: Every day | ORAL | 3 refills | Status: DC

## 2017-09-11 NOTE — Progress Notes (Signed)
Chief Complaint  Patient presents with  . Hypertension    one month follow up on hypertension.  . Leg Pain    also has been having pain in her left side buttock/hip that goes down her leg since last visit.    Patient presents for follow-up on hypertension.  At her visit last month she was started on HCTZ.  She didn't actually start the 1/2 of a 2m tablet until 4/16, and increased to the full tablet on 5/1.  BP's have been running 128-132/88-101.  Since increasing to full tablet 5/1, only checked it once, 118/90 on 5/2.  She stopped salting food, using a salt substitute just a little.  Trying to cut out processed foods. Denies headaches, chest pain, muscle cramps. Just slight cramps if she doesn't get her banana daily.  She has been getting 30 minutes of exercise most days (walking, or 15 and 15 on the elliptical), except not when she was hurt.  Doing it about 3 days/week, working up to try and do it daily.  2 weeks ago she had some pain in her left SI area, and radiated down the back of her leg, to the upper calf.  It lasted x 3 days straight, then eased off.  A couple of mornings she noticed it flaring.  OTC ibuprofen helps, uses it prn with good results.  She has also had some discomfort in her left foot if she moves it a certain way, pain can shoot up into the lateral leg.  Denies any known injury. Admits to wearing unsupportive flats every day.  PMH, PSH, SH reviewed  Outpatient Encounter Medications as of 09/12/2017  Medication Sig Note  . b complex vitamins tablet Take 1 tablet by mouth daily.   . cetirizine (ZYRTEC) 10 MG chewable tablet Chew 10 mg by mouth daily.   . cholecalciferol (VITAMIN D) 1000 UNITS tablet Take 1,000 Units by mouth daily. Reported on 07/30/2015 06/03/2016: daily  . Evening Primrose Oil 1000 MG CAPS Take by mouth.   . Ginkgo Biloba (GNP GINGKO BILOBA EXTRACT PO) Take 1 capsule by mouth daily.   . hydrochlorothiazide (HYDRODIURIL) 25 MG tablet Take 1/2 tablet by  mouth every morning. Increase to full tablet after a week if BP still >135/85 09/12/2017: Up to full tablet daily  . rosuvastatin (CRESTOR) 10 MG tablet Take 1 tablet (10 mg total) by mouth daily.    No facility-administered encounter medications on file as of 09/12/2017.    No Known Allergies  ROS:  No fever, chills, headaches, dizziness, chest pain, URI symptoms, cough, shortness of breath, chest pain, palpitations.  No bleeding, bruising.  Recent LBP radiating down leg, not in pain currently. No urinary complaints. No numbness, tingling, weakness.    PHYSICAL EXAM:  BP (!) 130/98   Pulse 84   Ht '5\' 8"'  (1.727 m)   Wt 206 lb 12.8 oz (93.8 kg)   LMP 08/10/2017   BMI 31.44 kg/m   126/88 on repeat by MD, RA  Wt Readings from Last 3 Encounters:  09/12/17 206 lb 12.8 oz (93.8 kg)  08/10/17 212 lb (96.2 kg)  03/01/17 203 lb 3.2 oz (92.2 kg)   Well developed pleasant female HEENT: conjunctiva and sclera are clear, EOMI, OP clear Neck: no lymphadenopathy or mass Heart: regular rate and rhythm, no murmur Lungs: clear bilaterally Back: no spinal tenderness, SI tenderness, paraspinous spasm or tenderness or CVA tenderness. Area of discomfort was left SI, nontender today Some discomfort with pyriformis stretch, but good  ROM Neuro: normal strength, sensation, gait, cranial nerves. DTR's 2+ and symmetric. Negative SLR.  Skin: normal turgor, no rash   ASSESSMENT/PLAN:  Essential hypertension - borderline high diastolics, though improved. Cont HCTZ 25, exercise, low Na diet, weight loss. - Plan: Basic metabolic panel  Medication monitoring encounter - Plan: Basic metabolic panel  Left-sided low back pain with left-sided sciatica, unspecified chronicity - improved; discussed NSAID, pyriformis stretches, core strengthening and working on hamstring flexibility. Consider chiro with recurrent of acute severe pain   b-met   CPE 08/2018 October med check

## 2017-09-12 ENCOUNTER — Encounter: Payer: Self-pay | Admitting: Family Medicine

## 2017-09-12 ENCOUNTER — Ambulatory Visit (INDEPENDENT_AMBULATORY_CARE_PROVIDER_SITE_OTHER): Payer: BLUE CROSS/BLUE SHIELD | Admitting: Family Medicine

## 2017-09-12 VITALS — BP 126/88 | HR 84 | Ht 68.0 in | Wt 206.8 lb

## 2017-09-12 DIAGNOSIS — M5442 Lumbago with sciatica, left side: Secondary | ICD-10-CM

## 2017-09-12 DIAGNOSIS — Z5181 Encounter for therapeutic drug level monitoring: Secondary | ICD-10-CM

## 2017-09-12 DIAGNOSIS — I10 Essential (primary) hypertension: Secondary | ICD-10-CM

## 2017-09-13 LAB — BASIC METABOLIC PANEL
BUN/Creatinine Ratio: 15 (ref 9–23)
BUN: 15 mg/dL (ref 6–24)
CALCIUM: 9.9 mg/dL (ref 8.7–10.2)
CHLORIDE: 102 mmol/L (ref 96–106)
CO2: 23 mmol/L (ref 20–29)
Creatinine, Ser: 1.03 mg/dL — ABNORMAL HIGH (ref 0.57–1.00)
GFR calc non Af Amer: 67 mL/min/{1.73_m2} (ref >59)
GFR, EST AFRICAN AMERICAN: 77 mL/min/{1.73_m2} (ref >59)
Glucose: 101 mg/dL — ABNORMAL HIGH (ref 65–99)
POTASSIUM: 4.5 mmol/L (ref 3.5–5.2)
SODIUM: 139 mmol/L (ref 134–144)

## 2017-10-05 ENCOUNTER — Ambulatory Visit (INDEPENDENT_AMBULATORY_CARE_PROVIDER_SITE_OTHER): Payer: BLUE CROSS/BLUE SHIELD | Admitting: Family Medicine

## 2017-10-05 ENCOUNTER — Encounter: Payer: Self-pay | Admitting: Family Medicine

## 2017-10-05 VITALS — BP 140/80 | HR 72 | Temp 98.1°F | Resp 18 | Ht 68.0 in | Wt 208.0 lb

## 2017-10-05 DIAGNOSIS — N898 Other specified noninflammatory disorders of vagina: Secondary | ICD-10-CM | POA: Diagnosis not present

## 2017-10-05 DIAGNOSIS — B373 Candidiasis of vulva and vagina: Secondary | ICD-10-CM

## 2017-10-05 DIAGNOSIS — N760 Acute vaginitis: Secondary | ICD-10-CM

## 2017-10-05 DIAGNOSIS — B3731 Acute candidiasis of vulva and vagina: Secondary | ICD-10-CM

## 2017-10-05 DIAGNOSIS — Z113 Encounter for screening for infections with a predominantly sexual mode of transmission: Secondary | ICD-10-CM

## 2017-10-05 DIAGNOSIS — B9689 Other specified bacterial agents as the cause of diseases classified elsewhere: Secondary | ICD-10-CM | POA: Diagnosis not present

## 2017-10-05 LAB — POCT WET PREP (WET MOUNT)
CLUE CELLS WET PREP WHIFF POC: POSITIVE
Trichomonas Wet Prep HPF POC: ABSENT

## 2017-10-05 MED ORDER — METRONIDAZOLE 500 MG PO TABS: 500.0000 mg | ORAL_TABLET | Freq: Two times a day (BID) | ORAL | 0 refills | Status: DC

## 2017-10-05 MED ORDER — FLUCONAZOLE 150 MG PO TABS: 150.0000 mg | ORAL_TABLET | Freq: Once | ORAL | 0 refills | Status: AC

## 2017-10-05 NOTE — Progress Notes (Signed)
   Subjective:    Patient ID: Chelsea Gould, female    DOB: July 02, 1975, 42 y.o.   MRN: [id number]  HPI Chief Complaint  Patient presents with  . Vaginal Discharge    and itching, no odor. Discharge is clear, some slight white. Wonders if her bp medication is contributing or if this could be an STD.    She is here with complaints of a 3-4 week history of vaginal itching, mild irritation, white discharge. No odor.  No urinary symptoms.   Recent sexual activity with her husband who is HIV +. Denies pain with intercourse but some vaginal dryness.  Would like STD testing today. Declines HIV testing since she just had this done.   Denies fever, chills, back pain, abdominal pain, N/V/D.     Review of Systems Pertinent positives and negatives in the history of present illness.     Objective:   Physical Exam  Constitutional: She appears well-developed and well-nourished. No distress.  Abdominal: Soft. Normal appearance.  Genitourinary: There is no rash, tenderness or lesion on the right labia. There is no rash, tenderness or lesion on the left labia. Cervix exhibits discharge. There is tenderness in the vagina. Vaginal discharge found.  Genitourinary Comments: Thin white adherent discharge to vagina and cervix. Report pain with speculum insertion to left side of vaginal wall.   Skin: Skin is warm and dry. No rash noted. No pallor.   BP 140/80   Pulse 72   Temp 98.1 F (36.7 C) (Tympanic)   Resp 18   Ht  (1.727 m)   Wt 208 lb (94.3 kg)   LMP 09/20/2017 (Exact Date)   BMI 31.63 kg/m       Assessment & Plan:  Candida vaginitis - Plan: fluconazole (DIFLUCAN) 150 MG tablet  Vaginal discharge - Plan: GC/Chlamydia Probe Amp, POCT Wet Prep (Wet [place])  Vaginal irritation - Plan: GC/Chlamydia Probe Amp, POCT Wet Prep [employer])  Screen for STD (sexually transmitted disease)  BV (bacterial vaginosis) - Plan: metroNIDAZOLE (FLAGYL) 500 MG tablet  Wet prep +BV and yeast,  -trich Discussed treatment of BV with metronidazole and to avoid alcohol with this medication.  Will treat for yeast with Diflucan.  GC/CT swab obtained. HIV test negative in the past 6-7 weeks.  Follow up if symptoms are not resolved after treatment.

## 2017-10-06 LAB — GC/CHLAMYDIA PROBE AMP
Chlamydia trachomatis, NAA: NEGATIVE
Neisseria gonorrhoeae by PCR: NEGATIVE

## 2017-11-01 ENCOUNTER — Other Ambulatory Visit: Payer: Self-pay | Admitting: Family Medicine

## 2017-11-01 DIAGNOSIS — I10 Essential (primary) hypertension: Secondary | ICD-10-CM

## 2017-12-31 ENCOUNTER — Other Ambulatory Visit: Payer: Self-pay | Admitting: Family Medicine

## 2017-12-31 DIAGNOSIS — I10 Essential (primary) hypertension: Secondary | ICD-10-CM

## 2018-01-04 ENCOUNTER — Telehealth: Payer: Self-pay | Admitting: Medical

## 2018-01-04 NOTE — Telephone Encounter (Signed)
Called and advised pt that letter and form ready for pick up

## 2018-01-04 NOTE — Telephone Encounter (Signed)
Please get mom the letter I wrote for school and I need the school medication form regarding use of inhaler.

## 2018-01-16 ENCOUNTER — Encounter: Payer: Self-pay | Admitting: Family Medicine

## 2018-02-19 NOTE — Progress Notes (Signed)
Chief Complaint  Patient presents with  . Medication Management   Patient presents for follow-up on hypertension. She is taking 25mg  of HCTZ daily.  She denies headaches, dizziness, chest pain, shortness of breath, edema or muscle cramps (only if she doesn't eat a banana daily).  She has been trying to follow low sodium diet, cut back on processed foods. Last BP she checked was 110/63, can't recall the other values, but all okay.  117/83 at the dentist in September.  She has been exercising daily x 2 weeks, planking, exercises on mat at home. Got out of the habit of walking and using elliptical, but plans to resume.  She starting working with a Control and instrumentation engineer at work last month.  Talks monthly--helping with exercise, dietary changes. Helps with accountability, next call is next week.  Prior to her visit in May she was having LBP with sciatica.  We had shown her some pyriformis stretches, discussed NSAID's prn and chiro if had recurrence. She has been okay, changing her shoes helped a lot. She had one very mild flare, and doing the stretches she was shown helped a lot.  Vitamin D deficiency: Level was 23 in 2017.  Last check was in 08/2017, level was normal at 49.6, while taking 1000 IU of D3 daily.  She continues to take 1000 IU every day.  Hyperlipidemia: on Crestor, takes each evening, and denies side effects.She is compliant with her medication.She istrying tofollowing a lowfat, low cholesterol diet. No longer uses creamy dressings.  Eats 2 scrambled eggs 1x/week.  Eats red meat 1-2x/week. Lab Results  Component Value Date   CHOL 149 08/10/2017   HDL 50 08/10/2017   LDLCALC 88 08/10/2017   TRIG 54 08/10/2017   CHOLHDL 3.0 08/10/2017     PMH, PSH, SH reviewed  Outpatient Encounter Medications as of 02/20/2018  Medication Sig Note  . b complex vitamins tablet Take 1 tablet by mouth daily.   . cetirizine (ZYRTEC) 10 MG chewable tablet Chew 10 mg by mouth daily.   .  cholecalciferol (VITAMIN D) 1000 UNITS tablet Take 1,000 Units by mouth daily. Reported on 07/30/2015 06/03/2016: daily  . Evening Primrose Oil 1000 MG CAPS Take by mouth.   . Ginkgo Biloba (GNP GINGKO BILOBA EXTRACT PO) Take 1 capsule by mouth daily.   . hydrochlorothiazide (HYDRODIURIL) 25 MG tablet TAKE 1/2 (ONE-HALF) TABLET BY MOUTH IN THE MORNING INCREASE  TO  FULL  TABLET  AFTER  A  WEEK  IF  BLOOD  PRESSURE  STILL> 135/85   . rosuvastatin (CRESTOR) 10 MG tablet Take 1 tablet (10 mg total) by mouth daily.   . [DISCONTINUED] metroNIDAZOLE (FLAGYL) 500 MG tablet Take 1 tablet (500 mg total) by mouth 2 (two) times daily.    No facility-administered encounter medications on file as of 02/20/2018.    No Known Allergies  ROS: No fever, chills, URI symptoms, headaches, chest pain, palpitations, shortness of breath, edema. No nausea, vomiting, bowel changes. No bleeding, bruising, rash.  No joint pain. Energy and moods are good.   PHYSICAL EXAM:  BP 110/80   Pulse 67   Temp 98.2 F (36.8 C) (Oral)   Ht 5\' 8"  (1.727 m)   Wt 210 lb 6.4 oz (95.4 kg)   SpO2 99%   BMI 31.99 kg/m    Wt Readings from Last 3 Encounters:  02/20/18 210 lb 6.4 oz (95.4 kg)  10/05/17 208 lb (94.3 kg)  09/12/17 206 lb 12.8 oz (93.8 kg)   122/82  on repeat by MD  Well developed pleasant female HEENT: conjunctiva and sclera are clear, EOMI, OP clear Neck: no lymphadenopathy or mass Heart: regular rate and rhythm, no murmur Lungs: clear bilaterally Back: no spinal tenderness or CVA tenderness. Neuro: normal gait, cranial nerves.  Skin: normal turgor, no rash   ASSESSMENT/PLAN:   Essential hypertension - well controlled. Encouraged regular aerobic exercise, cont low Na diet, weight loss.  Discussed goals, answered ?'s re: how/when to taper/stop meds   Pure hypercholesterolemia - cont statin, lowfat, low cholesterol diet; increase exercise  Vitamin D deficiency - continue daily supplement  Need for  influenza vaccination - Plan: Flu Vaccine QUAD 6+ mos PF IM (Fluarix Quad PF)    F/u in April as scheduled for CPE, to come fasting  Answered questions about goals, coming off meds, etc Counseled re: diet, exercise, goals (losing inches at waist, not just pound)

## 2018-02-20 ENCOUNTER — Ambulatory Visit (INDEPENDENT_AMBULATORY_CARE_PROVIDER_SITE_OTHER): Payer: BLUE CROSS/BLUE SHIELD | Admitting: Family Medicine

## 2018-02-20 ENCOUNTER — Encounter: Payer: Self-pay | Admitting: Family Medicine

## 2018-02-20 VITALS — BP 110/80 | HR 67 | Temp 98.2°F | Ht 68.0 in | Wt 210.4 lb

## 2018-02-20 DIAGNOSIS — Z23 Encounter for immunization: Secondary | ICD-10-CM

## 2018-02-20 DIAGNOSIS — E559 Vitamin D deficiency, unspecified: Secondary | ICD-10-CM

## 2018-02-20 DIAGNOSIS — E7800 Pure hypercholesterolemia, unspecified: Secondary | ICD-10-CM

## 2018-02-20 DIAGNOSIS — I10 Essential (primary) hypertension: Secondary | ICD-10-CM

## 2018-02-20 NOTE — Patient Instructions (Signed)
Continue your current medications. Continue to record your blood pressure and bring list to your physical. Contact us sooner if having any dizziness, or consistently low blood pressures (<110 systolic). Continue to work on diet, and increase exercise to 150 minutes of aerobic activity per week.

## 2018-04-23 ENCOUNTER — Other Ambulatory Visit: Payer: Self-pay | Admitting: Family Medicine

## 2018-04-23 DIAGNOSIS — I10 Essential (primary) hypertension: Secondary | ICD-10-CM

## 2018-08-17 ENCOUNTER — Other Ambulatory Visit: Payer: Self-pay | Admitting: *Deleted

## 2018-08-17 DIAGNOSIS — Z5181 Encounter for therapeutic drug level monitoring: Secondary | ICD-10-CM

## 2018-08-17 DIAGNOSIS — Z114 Encounter for screening for human immunodeficiency virus [HIV]: Secondary | ICD-10-CM

## 2018-08-17 DIAGNOSIS — E785 Hyperlipidemia, unspecified: Secondary | ICD-10-CM

## 2018-08-21 ENCOUNTER — Encounter: Payer: BLUE CROSS/BLUE SHIELD | Admitting: Family Medicine

## 2018-08-29 ENCOUNTER — Other Ambulatory Visit: Payer: Self-pay | Admitting: Family Medicine

## 2018-08-29 DIAGNOSIS — I10 Essential (primary) hypertension: Secondary | ICD-10-CM

## 2018-08-29 NOTE — Telephone Encounter (Signed)
Is this ok to refill?  

## 2018-10-16 ENCOUNTER — Other Ambulatory Visit: Payer: Self-pay

## 2018-10-16 ENCOUNTER — Other Ambulatory Visit: Payer: BC Managed Care – PPO

## 2018-10-16 DIAGNOSIS — E785 Hyperlipidemia, unspecified: Secondary | ICD-10-CM

## 2018-10-16 DIAGNOSIS — Z5181 Encounter for therapeutic drug level monitoring: Secondary | ICD-10-CM

## 2018-10-16 DIAGNOSIS — Z114 Encounter for screening for human immunodeficiency virus [HIV]: Secondary | ICD-10-CM

## 2018-10-17 LAB — CBC WITH DIFFERENTIAL/PLATELET
Basophils Absolute: 0.1 10*3/uL (ref 0.0–0.2)
Basos: 1 %
EOS (ABSOLUTE): 0.1 10*3/uL (ref 0.0–0.4)
Eos: 2 %
Hematocrit: 36.8 % (ref 34.0–46.6)
Hemoglobin: 11.9 g/dL (ref 11.1–15.9)
Immature Grans (Abs): 0 10*3/uL (ref 0.0–0.1)
Immature Granulocytes: 0 %
Lymphocytes Absolute: 2.1 10*3/uL (ref 0.7–3.1)
Lymphs: 44 %
MCH: 27.4 pg (ref 26.6–33.0)
MCHC: 32.3 g/dL (ref 31.5–35.7)
MCV: 85 fL (ref 79–97)
Monocytes Absolute: 0.4 10*3/uL (ref 0.1–0.9)
Monocytes: 8 %
Neutrophils Absolute: 2.1 10*3/uL (ref 1.4–7.0)
Neutrophils: 45 %
Platelets: 332 10*3/uL (ref 150–450)
RBC: 4.35 x10E6/uL (ref 3.77–5.28)
RDW: 13.4 % (ref 11.7–15.4)
WBC: 4.8 10*3/uL (ref 3.4–10.8)

## 2018-10-17 LAB — COMPREHENSIVE METABOLIC PANEL
ALT: 19 IU/L (ref 0–32)
AST: 28 IU/L (ref 0–40)
Albumin/Globulin Ratio: 1.6 (ref 1.2–2.2)
Albumin: 4.3 g/dL (ref 3.8–4.8)
Alkaline Phosphatase: 65 IU/L (ref 39–117)
BUN/Creatinine Ratio: 15 (ref 9–23)
BUN: 17 mg/dL (ref 6–24)
Bilirubin Total: 0.3 mg/dL (ref 0.0–1.2)
CO2: 26 mmol/L (ref 20–29)
Calcium: 9.7 mg/dL (ref 8.7–10.2)
Chloride: 102 mmol/L (ref 96–106)
Creatinine, Ser: 1.14 mg/dL — ABNORMAL HIGH (ref 0.57–1.00)
GFR calc Af Amer: 68 mL/min/{1.73_m2} (ref >59)
GFR calc non Af Amer: 59 mL/min/{1.73_m2} — ABNORMAL LOW (ref >59)
Globulin, Total: 2.7 g/dL (ref 1.5–4.5)
Glucose: 109 mg/dL — ABNORMAL HIGH (ref 65–99)
Potassium: 3.7 mmol/L (ref 3.5–5.2)
Sodium: 139 mmol/L (ref 134–144)
Total Protein: 7 g/dL (ref 6.0–8.5)

## 2018-10-17 LAB — LIPID PANEL
Chol/HDL Ratio: 3 ratio (ref 0.0–4.4)
Cholesterol, Total: 148 mg/dL (ref 100–199)
HDL: 50 mg/dL (ref >39)
LDL Calculated: 87 mg/dL (ref 0–99)
Triglycerides: 56 mg/dL (ref 0–149)
VLDL Cholesterol Cal: 11 mg/dL (ref 5–40)

## 2018-10-17 LAB — HIV ANTIBODY (ROUTINE TESTING W REFLEX): HIV Screen 4th Generation wRfx: NONREACTIVE

## 2018-10-17 NOTE — Progress Notes (Signed)
Chief Complaint  Patient presents with  . cpe    cpe with pelvic. no other concerns    Chelsea Gould is a 43 y.o. female who presents for a complete physical and follow-up on chronic problems . She had labs done prior to her visit, see below.  Hypertension. She is taking 25mg  of HCTZ daily.  She denies headaches, dizziness, chest pain, shortness of breath, edema or muscle cramps (she started taking OTC potassium supplement, rather than eating banana daily, no further cramps).  She has been trying to follow low sodium diet, cut back on processed foods. BP's haven't been checked elsewhere recently.  She had been working with a Control and instrumentation engineerhealth coach at work, which had included monthly talks, helping with exercise, dietary changes. Helped with accountability. This hasn't been available over the last few months (pandemic).  Still tries to follow guidelines.  Weighs daily. Exercises daily and it eating healthy.  Vitamin D deficiency: Level was 23in 2017. Last check was in 08/2017, level was normal at 49.6, while taking 1000 IU of D3 daily.  She continues to take1000 IU every day.  Hyperlipidemia: on Crestor, takes each evening, and denies side effects.She is compliant with her medication.She istrying tofollowing a lowfat, low cholesterol diet. No longer uses creamy dressings. Eats 2 scrambled eggs 1x/week. Eats red meat 1-2x/week, even less over the last month or two.  Husband has HIV--she reports that he is compliant with his treatment, and his counts are "good". Also reports they have been better about using condoms, but admittedly not always. Previously discussed PreP--wasn't interested due to the price.  Immunization History  Administered Date(s) Administered  . Influenza Split 02/08/2011  . Influenza,inj,Quad PF,6+ Mos 05/17/2013, 12/31/2013, 06/03/2016, 03/01/2017, 02/20/2018  . Tdap 02/08/2011   Last Pap smear: 07/2016; negative, with no high risk HPV Last mammogram: 12/2016  normal Last colonoscopy: never  Last DEXA: never  Ophtho: Every 2 years(has glasses) Dentist:regularly  Exercise: elliptical 6 days/week for 1 hour  Past Medical History:  Diagnosis Date  . Asthma    childhood  . GERD (gastroesophageal reflux disease)   . Hypertension   . Pure hypercholesterolemia     Past Surgical History:  Procedure Laterality Date  . OOPHORECTOMY  2003   for ovarian cysts--LEFT    Social History   Socioeconomic History  . Marital status: Married    Spouse name: Not on file  . Number of children: 2  . Years of education: Not on file  . Highest education level: Not on file  Occupational History  . Not on file  Social Needs  . Financial resource strain: Not on file  . Food insecurity    Worry: Not on file    Inability: Not on file  . Transportation needs    Medical: Not on file    Non-medical: Not on file  Tobacco Use  . Smoking status: Never Smoker  . Smokeless tobacco: Never Used  Substance and Sexual Activity  . Alcohol use: Yes    Alcohol/week: 0.0 standard drinks    Comment: 0-2 drinks per month  . Drug use: No  . Sexual activity: Yes    Partners: Male    Birth control/protection: Other-see comments, Condom    Comment: husband with vasectomy; also uses condoms "sometimes". Husband with HIV  Lifestyle  . Physical activity    Days per week: Not on file    Minutes per session: Not on file  . Stress: Not on file  Relationships  . Social  connections    Talks on phone: Not on file    Gets together: Not on file    Attends religious service: Not on file    Active member of club or organization: Not on file    Attends meetings of clubs or organizations: Not on file    Relationship status: Not on file  . Intimate partner violence    Fear of current or ex partner: Not on file    Emotionally abused: Not on file    Physically abused: Not on file    Forced sexual activity: Not on file  Other Topics Concern  . Not on file  Social  History Narrative   Lives with husband, 2 sons (10, 2224 in 08/2017)   Husband has HIV, and doing well on meds (diagnosed in 2011). 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   Family History  Problem Relation Age of Onset  . Heart disease Mother 2260       CABG at 7961  . Hyperlipidemia Mother   . Hypertension Mother   . Diabetes Mother   . Breast cancer Mother 7475  . Cancer Mother 2875       breast  . Arthritis Father   . Other Father        died of internal bleeding  . Asthma Son   . Asthma Son   . Depression Sister   . Hypertension Sister   . Diabetes Maternal Aunt   . Diabetes Maternal Uncle     Outpatient Encounter Medications as of 10/19/2018  Medication Sig Note  . b complex vitamins tablet Take 1 tablet by mouth daily.   . cetirizine (ZYRTEC) 10 MG chewable tablet Chew 10 mg by mouth daily.   . cholecalciferol (VITAMIN D) 1000 UNITS tablet Take 1,000 Units by mouth daily. Reported on 07/30/2015 06/03/2016: daily  . Evening Primrose Oil 1000 MG CAPS Take by mouth.   . Ginkgo Biloba (GNP GINGKO BILOBA EXTRACT PO) Take 1 capsule by mouth daily.   . hydrochlorothiazide (HYDRODIURIL) 25 MG tablet Take 1 tablet by mouth once daily   . rosuvastatin (CRESTOR) 10 MG tablet Take 1 tablet (10 mg total) by mouth daily.    No facility-administered encounter medications on file as of 10/19/2018.     No Known Allergies  ROS: The patient denies anorexia, fever, vision changes, decreased hearing, ear pain, sore throat, breast concerns, chest pain, palpitations, dizziness, syncope, dyspnea on exertion, cough, swelling, nausea, vomiting, diarrhea, constipation, abdominal pain, melena, hematochezia, indigestion/heartburn (infrequent), hematuria, vaginal discharge, odor or itch, genital lesions, joint pains, numbness, tingling, weakness, tremor, suspicious skin lesions, depression, anxiety, abnormal bleeding/bruising, or enlarged  lymph nodes. Seasonal allergies, controlled by zyrtec. Gets some sinus headaches, no longer getting the more severe headaches/migraines. Intermittently having irregular menses, less often now.   PHYSICAL EXAM:  BP 120/80   Pulse 73   Temp (!) 97.5 F (36.4 C) (Oral)   Ht 5\' 8"  (1.727 m)   Wt 207 lb (93.9 kg)   LMP 09/22/2018   SpO2 97%   BMI 31.47 kg/m   Wt Readings from Last 3 Encounters:  10/19/18 207 lb (93.9 kg)  02/20/18 210 lb 6.4 oz (95.4 kg)  10/05/17 208 lb (94.3 kg)    General Appearance:  Alert, cooperative, no distress, appears stated age   Head:  Normocephalic, without obvious abnormality, atraumatic   Eyes:  PERRL, conjunctiva/corneas clear, EOM's intact, fundi benign  Ears:  Normal TM's and external ear canals   Nose:  Not examined (wearing mask due to COVID-19 pandemic)  Throat:  Not examined (wearing mask due to COVID-19 pandemic)  Neck:  Supple, no lymphadenopathy; thyroid: no enlargement/tenderness/ nodules; no carotid bruit or JVD   Back:  Spine nontender, no curvature, ROM normal, no CVA tenderness   Lungs:  Clear to auscultation bilaterally without wheezes, rales or ronchi; respirations unlabored   Chest Wall:  No tenderness or deformity   Heart:  Regular rate and rhythm, S1 and S2 normal, no murmur, rub or gallop   Breast Exam:  No tenderness, masses, or nipple discharge or inversion. No axillary lymphadenopathy.   Abdomen:  Soft, non-tender, nondistended, normoactive bowel sounds, no masses, no hepatosplenomegaly   Genitalia:  Normal external genitalia without lesions. BUS and vagina normal; nocervical motion tenderness. +Nabothian cysts palpable. Uterus and adnexa not enlarged, nontender, no masses. Pap not performed   Rectal:  Normal sphincter tone; no masses; heme negative stool  Extremities:  No clubbing, cyanosis or edema   Pulses:  2+ and symmetric all extremities   Skin:  Skin  color, texture, turgor normal, no rashes or lesions. Seborrheic keratosis at 4 o'clock position of left breast, unchanged  Lymph nodes:  Cervical, supraclavicular, and axillary nodes normal   Neurologic:  CNII-XII intact, normal strength, sensation and gait; reflexes 2+ and symmetric in upper extremities, 1+ and symmetric in lower extremities    Psych: Normal mood, affect, hygiene and grooming. She became tearful in discussing the loss of her Mother (in September), difficult Mother's Day.   Lab Results  Component Value Date   CHOL 148 10/16/2018   HDL 50 10/16/2018   LDLCALC 87 10/16/2018   TRIG 56 10/16/2018   CHOLHDL 3.0 10/16/2018     Chemistry      Component Value Date/Time   NA 139 10/16/2018 0916   K 3.7 10/16/2018 0916   CL 102 10/16/2018 0916   CO2 26 10/16/2018 0916   BUN 17 10/16/2018 0916   CREATININE 1.14 (H) 10/16/2018 0916   CREATININE 1.06 12/15/2016 0753      Component Value Date/Time   CALCIUM 9.7 10/16/2018 0916   ALKPHOS 65 10/16/2018 0916   AST 28 10/16/2018 0916   ALT 19 10/16/2018 0916   BILITOT 0.3 10/16/2018 0916     Fasting glu 109  HIV negative  Lab Results  Component Value Date   WBC 4.8 10/16/2018   HGB 11.9 10/16/2018   HCT 36.8 10/16/2018   MCV 85 10/16/2018   PLT 332 10/16/2018   Lab Results  Component Value Date   HGBA1C 5.4 10/19/2018     ASSESSMENT/PLAN:  Annual physical exam   Essential hypertension - well controlled   Pure hypercholesterolemia - at goal, continue crestor and low cholesterol diet - Plan: rosuvastatin (CRESTOR) 10 MG tablet  Vitamin D deficiency - continue daily supplement   Impaired fasting glucose - normal A1c. Reviewed diet, exercise, risks for diabetes (prediabetic). - Plan: HgB A1c  HIV exposure - encouraged regular condom use   Discussed moods, grief related to loss of her mother. Encouraged her to consider grief counseling. Some counseling done.   Discussed  monthly self breast exams and yearly mammograms (past due, encouraged to schedule, reviewed guidelines); at least 30 minutes of aerobic activity at least 5 days/week, add weight bearing exercise 2-3 times/week; proper sunscreen use reviewed; healthy diet, including goals of calcium and vitamin D intake and alcohol recommendations (less than or  equal to 1 drink/day) reviewed; regular seatbelt use; changing batteries in smoke detectors. Immunization recommendations discussed--continue yearly flu shots. Colonoscopy recommendations reviewed--age 56, sooner prn (ie if insurance starts covering at age 53, or changes in [place] or bowels).  F/u 1 year, sooner prn

## 2018-10-17 NOTE — Patient Instructions (Addendum)
  HEALTH MAINTENANCE RECOMMENDATIONS:  It is recommended that you get at least 30 minutes of aerobic exercise at least 5 days/week (for weight loss, you may need as much as 60-90 minutes). This can be any activity that gets your heart rate up. This can be divided in 10-15 minute intervals if needed, but try and build up your endurance at least once a week.  Weight bearing exercise is also recommended twice weekly.  Eat a healthy diet with lots of vegetables, fruits and fiber.  "Colorful" foods have a lot of vitamins (ie green vegetables, tomatoes, red peppers, etc).  Limit sweet tea, regular sodas and alcoholic beverages, all of which has a lot of calories and sugar.  Up to 1 alcoholic drink daily may be beneficial for women (unless trying to lose weight, watch sugars).  Drink a lot of water.  Calcium recommendations are 1200-1500 mg daily (1500 mg for postmenopausal women or women without ovaries), and vitamin D 1000 IU daily.  This should be obtained from diet and/or supplements (vitamins), and calcium should not be taken all at once, but in divided doses.  Monthly self breast exams and yearly mammograms for women over the age of 53 is recommended.  Sunscreen of at least SPF 30 should be used on all sun-exposed parts of the skin when outside between the hours of 10 am and 4 pm (not just when at beach or pool, but even with exercise, golf, tennis, and yard work!)  Use a sunscreen that says "broad spectrum" so it covers both UVA and UVB rays, and make sure to reapply every 1-2 hours.  Remember to change the batteries in your smoke detectors when changing your clock times in the spring and fall. Carbon monoxide detectors are recommended for your home.  Use your seat belt every time you are in a car, and please drive safely and not be distracted with cell phones and texting while driving.  You are past due for yearly mammogram. Please call the [facility] to schedule.  Consider grief counseling  (can check on this through Hospice)

## 2018-10-19 ENCOUNTER — Other Ambulatory Visit: Payer: Self-pay

## 2018-10-19 ENCOUNTER — Encounter: Payer: Self-pay | Admitting: Family Medicine

## 2018-10-19 ENCOUNTER — Ambulatory Visit (INDEPENDENT_AMBULATORY_CARE_PROVIDER_SITE_OTHER): Payer: BC Managed Care – PPO | Admitting: Family Medicine

## 2018-10-19 VITALS — BP 120/80 | HR 73 | Temp 97.5°F | Ht 68.0 in | Wt 207.0 lb

## 2018-10-19 DIAGNOSIS — Z0000 Encounter for general adult medical examination without abnormal findings: Secondary | ICD-10-CM

## 2018-10-19 DIAGNOSIS — Z206 Contact with and (suspected) exposure to human immunodeficiency virus [HIV]: Secondary | ICD-10-CM

## 2018-10-19 DIAGNOSIS — E7800 Pure hypercholesterolemia, unspecified: Secondary | ICD-10-CM | POA: Diagnosis not present

## 2018-10-19 DIAGNOSIS — E559 Vitamin D deficiency, unspecified: Secondary | ICD-10-CM | POA: Diagnosis not present

## 2018-10-19 DIAGNOSIS — R7301 Impaired fasting glucose: Secondary | ICD-10-CM

## 2018-10-19 DIAGNOSIS — I10 Essential (primary) hypertension: Secondary | ICD-10-CM | POA: Diagnosis not present

## 2018-10-19 LAB — POCT GLYCOSYLATED HEMOGLOBIN (HGB A1C): Hemoglobin A1C: 5.4 % (ref 4.0–5.6)

## 2018-10-19 MED ORDER — ROSUVASTATIN CALCIUM 10 MG PO TABS: 10.0000 mg | ORAL_TABLET | Freq: Every day | ORAL | 3 refills | Status: AC

## 2018-12-13 ENCOUNTER — Telehealth: Payer: Self-pay | Admitting: Family Medicine

## 2018-12-13 NOTE — Telephone Encounter (Signed)
Dismissal letter in guarantor snapshot  °

## 2018-12-15 ENCOUNTER — Other Ambulatory Visit: Payer: Self-pay | Admitting: Family Medicine

## 2018-12-15 DIAGNOSIS — I10 Essential (primary) hypertension: Secondary | ICD-10-CM

## 2018-12-19 ENCOUNTER — Other Ambulatory Visit: Payer: Self-pay | Admitting: Family Medicine

## 2018-12-19 DIAGNOSIS — I10 Essential (primary) hypertension: Secondary | ICD-10-CM

## 2018-12-24 ENCOUNTER — Other Ambulatory Visit: Payer: Self-pay | Admitting: Family Medicine

## 2018-12-24 DIAGNOSIS — I10 Essential (primary) hypertension: Secondary | ICD-10-CM

## 2018-12-25 MED ORDER — HYDROCHLOROTHIAZIDE 25 MG PO TABS: ORAL_TABLET | ORAL | 0 refills | Status: AC

## 2018-12-25 NOTE — Telephone Encounter (Signed)
Patient was dismissed as of 12/14/2018, do you want to fill for 30 or 90 days? She just had CPE in June.

## 2019-03-29 ENCOUNTER — Other Ambulatory Visit: Payer: Self-pay | Admitting: Family Medicine

## 2019-03-29 DIAGNOSIS — Z1231 Encounter for screening mammogram for malignant neoplasm of breast: Secondary | ICD-10-CM

## 2019-05-21 ENCOUNTER — Other Ambulatory Visit: Payer: Self-pay

## 2019-05-21 ENCOUNTER — Ambulatory Visit
Admission: RE | Admit: 2019-05-21 | Discharge: 2019-05-21 | Disposition: A | Discharge status: Home / Self Care | Payer: BLUE CROSS/BLUE SHIELD | Source: Ambulatory Visit | Attending: Family Medicine | Admitting: Family Medicine

## 2019-05-21 DIAGNOSIS — Z1231 Encounter for screening mammogram for malignant neoplasm of breast: Secondary | ICD-10-CM

## 2019-10-25 ENCOUNTER — Encounter: Payer: BC Managed Care – PPO | Admitting: Family Medicine

## 2020-02-10 ENCOUNTER — Other Ambulatory Visit: Payer: Self-pay

## 2020-02-10 ENCOUNTER — Encounter (HOSPITAL_COMMUNITY): Payer: Self-pay | Admitting: Emergency Medicine

## 2020-02-10 ENCOUNTER — Telehealth (HOSPITAL_COMMUNITY): Payer: Self-pay | Admitting: *Deleted

## 2020-02-10 ENCOUNTER — Ambulatory Visit (HOSPITAL_COMMUNITY)
Admission: EM | Admit: 2020-02-10 | Discharge: 2020-02-10 | Disposition: A | Discharge status: Home / Self Care | Payer: BC Managed Care – PPO | Attending: Family Medicine | Admitting: Family Medicine

## 2020-02-10 DIAGNOSIS — T148XXA Other injury of unspecified body region, initial encounter: Secondary | ICD-10-CM

## 2020-02-10 DIAGNOSIS — M62838 Other muscle spasm: Secondary | ICD-10-CM | POA: Diagnosis not present

## 2020-02-10 MED ORDER — CYCLOBENZAPRINE HCL 5 MG PO TABS: 5.0000 mg | ORAL_TABLET | Freq: Three times a day (TID) | ORAL | 0 refills | Status: AC | PRN

## 2020-02-10 MED ORDER — NAPROXEN 500 MG PO TABS: 500.0000 mg | ORAL_TABLET | Freq: Two times a day (BID) | ORAL | 0 refills | Status: DC

## 2020-02-10 MED ORDER — KETOROLAC TROMETHAMINE 30 MG/ML IJ SOLN
INTRAMUSCULAR | Status: AC
  Filled 2020-02-10: qty 1

## 2020-02-10 MED ORDER — KETOROLAC TROMETHAMINE 30 MG/ML IJ SOLN
30.0000 mg | Freq: Once | INTRAMUSCULAR | Status: AC
  Administered 2020-02-10: 30 mg via INTRAMUSCULAR

## 2020-02-10 MED ORDER — CYCLOBENZAPRINE HCL 5 MG PO TABS: 5.0000 mg | ORAL_TABLET | Freq: Three times a day (TID) | ORAL | 0 refills | Status: DC | PRN

## 2020-02-10 MED ORDER — NAPROXEN 500 MG PO TABS: 500.0000 mg | ORAL_TABLET | Freq: Two times a day (BID) | ORAL | 0 refills | Status: AC

## 2020-02-10 NOTE — Discharge Instructions (Addendum)
I believe this is muscle strain/spasm with some nerve inflammation.  Toradol injection given here for pain. Naproxen twice a day for pain and Flexeril 3 times a day as needed for muscle relaxant. Recommend heat to the area and gentle stretching and massage. Follow up as needed for continued or worsening symptoms

## 2020-02-10 NOTE — ED Triage Notes (Signed)
Pt with guarded movements.  States pain is worse with any movement, deep breathing, and palpation.

## 2020-02-10 NOTE — ED Triage Notes (Signed)
Pt c/o of "extreme stiffness" in chest, back and neck starting approx Friday afternoon. Sts she has had this happen before however did not last this long. Denies SOB, dizziness and cardiac hx. Pt took 1 800mg  advil this AM around approx 10am and pt has used biofreeze. Denies injury.

## 2020-02-11 NOTE — ED Provider Notes (Signed)
[facility]    CSN: [id number] Arrival date & time: 02/10/20  1213      History   Chief Complaint Chief Complaint  Patient presents with  . Chest Pain    HPI Chelsea Gould is a 44 y.o. female.   Patient is a 44 year old female presents today with stiffness in chest, back and neck area that started Friday afternoon.  This is all located to the right side.  Denies any specific injuries prior to this starting.  The pain is worse with movement, deep breathing and palpation.  Took 800 mg ibuprofen around 10:00 this morning and had mild relief from this.  Some pain down the right arm with some associated numbness and tingling.  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     Past Medical History:  Diagnosis Date  . Asthma    childhood  . GERD (gastroesophageal reflux disease)   . Hypertension   . Pure hypercholesterolemia     Patient Active Problem List   Diagnosis Date Noted  . Injury of adductor muscle and tendon of left thigh 12/29/2016  . 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 Obesity (BMI 30.0-34.9) 08/04/2016  . Vitamin D deficiency 12/31/2013  . Family history of heart disease in female family member before age 43 12/31/2013  . Cramp of both lower extremities 12/31/2013  . Exposure to HIV 02/08/2011  . Pure hypercholesterolemia 02/08/2011  . Irregular menses 12/28/2010    Past Surgical History:  Procedure Laterality Date  . OOPHORECTOMY  2003   for ovarian cysts--LEFT    OB History    Gravida  3   Para  2   Term  2   Preterm      AB  1   Living  2     SAB      TAB  1   Ectopic      Multiple      Live Births               Home Medications    Prior to Admission medications   Medication Sig Start Date End Date Taking? Authorizing Provider  b complex vitamins tablet Take 1 tablet by mouth daily.   Yes [provider]  cholecalciferol (VITAMIN D) 1000 UNITS tablet Take 1,000  Units by mouth daily. Reported on 07/30/2015   Yes [provider]  hydrochlorothiazide (HYDRODIURIL) 25 MG tablet Take 1 tablet by mouth once daily 12/25/18  Yes Joselyn Arrow, MD  rosuvastatin (CRESTOR) 10 MG tablet Take 1 tablet (10 mg total) by mouth daily. 10/19/18  Yes Joselyn Arrow, MD  cetirizine (ZYRTEC) 10 MG chewable tablet Chew 10 mg by mouth daily.    [provider]  cyclobenzaprine (FLEXERIL) 5 MG tablet Take 1 tablet (5 mg total) by mouth 3 (three) times daily as needed for muscle spasms. 02/10/20   Jarron Curley, Gloris Manchester A, NP  Evening Primrose Oil 1000 MG CAPS Take by mouth.    [provider]  Ginkgo Biloba (GNP GINGKO BILOBA EXTRACT PO) Take 1 capsule by mouth daily.    [provider]  naproxen (NAPROSYN) 500 MG tablet Take 1 tablet (500 mg total) by mouth 2 (two) times daily. 02/10/20   Janace Aris, NP    Family History Family History  Problem Relation Age of Onset  . Heart disease Mother 24       CABG at 65  . Hyperlipidemia Mother   . Hypertension  Mother   . Diabetes Mother   . Breast cancer Mother 61  . Cancer Mother 78       breast  . Arthritis Father   . Other Father        died of internal bleeding  . Asthma Son   . Asthma Son   . Depression Sister   . Hypertension Sister   . Diabetes Maternal Aunt   . Diabetes Maternal Uncle     Social History Social History   Tobacco Use  . Smoking status: Never Smoker  . Smokeless tobacco: Never Used  Vaping Use  . Vaping Use: Never used  Substance Use Topics  . Alcohol use: Yes    Alcohol/week: 0.0 standard drinks    Comment: 0-2 drinks per month  . Drug use: No     Allergies   Patient has no known allergies.   Review of Systems Review of Systems   Physical Exam Triage Vital Signs ED Triage Vitals  Enc Vitals Group     BP 02/10/20 1327 140/81     Pulse Rate 02/10/20 1327 68     Resp 02/10/20 1327 16     Temp 02/10/20 1327 97.9 F (36.6 C)     Temp Source 02/10/20 1327  Oral     SpO2 02/10/20 1327 100 %     Weight --      Height --      Head Circumference --      Peak Flow --      Pain Score 02/10/20 1324 10     Pain Loc --      Pain Edu? --      Excl. in GC? --    No data found.  Updated Vital Signs BP 140/81 (BP Location: Left Arm)   Pulse 68   Temp 97.9 F (36.6 C) (Oral)   Resp 16   LMP 01/08/2020 (Within Days)   SpO2 100%   Visual Acuity Right Eye Distance:   Left Eye Distance:   Bilateral Distance:    Right Eye Near:   Left Eye Near:    Bilateral Near:     Physical Exam Vitals and nursing note reviewed.  Constitutional:      General: She is not in acute distress.    Appearance: Normal appearance. She is not ill-appearing, toxic-appearing or diaphoretic.  HENT:     Head: Normocephalic.     Nose: Nose normal.  Eyes:     Conjunctiva/sclera: Conjunctivae normal.  Pulmonary:     Effort: Pulmonary effort is normal.  Chest:     Chest wall: Tenderness present.     Comments: Tenderness to palpation of right upper chest wall Musculoskeletal:        General: Normal range of motion.     Cervical back: Normal range of motion.     Comments: Tenderness palpation of trapezius muscle, right upper back.  generalized shoulder tenderness with palpation with normal range of motion. 2+ radial pulse in the right arm Normal strength No swelling   Skin:    General: Skin is warm and dry.     Findings: No rash.  Neurological:     Mental Status: She is alert.  Psychiatric:        Mood and Affect: Mood normal.      UC Treatments / Results  Labs (all labs ordered are listed, but only abnormal results are displayed) Labs Reviewed - No data to display  EKG   Radiology No results found.  Procedures Procedures (including critical care time)  Medications Ordered in UC Medications  ketorolac (TORADOL) 30 MG/ML injection 30 mg (30 mg Intramuscular Given 02/10/20 1435)    Initial Impression / Assessment and Plan / UC Course  I have  reviewed the triage vital signs and the nursing notes.  Pertinent labs & imaging results that were available during my care of the patient were reviewed by me and considered in my medical decision making (see chart for details).     Muscle spasming in compression with inflammation. Toradol injection given here for pain. Will prescribe naproxen twice a day for pain, inflammation and Flexeril 3 times a day as needed for muscle spasming and relaxant. Recommend heat to the area gentle stretching massage. Follow up as needed for continued or worsening symptoms  Final Clinical Impressions(s) / UC Diagnoses   Final diagnoses:  Muscle spasm  Nerve compression     Discharge Instructions     I believe this is muscle strain/spasm with some nerve inflammation.  Toradol injection given here for pain. Naproxen twice a day for pain and Flexeril 3 times a day as needed for muscle relaxant. Recommend heat to the area and gentle stretching and massage. Follow up as needed for continued or worsening symptoms     ED Prescriptions    Medication Sig Dispense Auth. Provider   naproxen (NAPROSYN) 500 MG tablet Take 1 tablet (500 mg total) by mouth 2 (two) times daily. 30 tablet Emireth Cockerham A, NP   cyclobenzaprine (FLEXERIL) 5 MG tablet Take 1 tablet (5 mg total) by mouth 3 (three) times daily as needed for muscle spasms. 30 tablet Dahlia Byes A, NP     PDMP not reviewed this encounter.   Janace Aris, NP 02/11/20 [phone]

## 2020-07-03 ENCOUNTER — Other Ambulatory Visit: Payer: Self-pay | Admitting: Family Medicine

## 2020-07-03 DIAGNOSIS — Z1231 Encounter for screening mammogram for malignant neoplasm of breast: Secondary | ICD-10-CM

## 2020-08-22 ENCOUNTER — Inpatient Hospital Stay: Admission: RE | Admit: 2020-08-22 | Payer: BC Managed Care – PPO | Source: Ambulatory Visit

## 2020-08-30 ENCOUNTER — Ambulatory Visit
Admission: RE | Admit: 2020-08-30 | Discharge: 2020-08-30 | Disposition: A | Discharge status: Home / Self Care | Payer: BC Managed Care – PPO | Source: Ambulatory Visit | Attending: Family Medicine | Admitting: Family Medicine

## 2020-08-30 ENCOUNTER — Other Ambulatory Visit: Payer: Self-pay

## 2020-08-30 DIAGNOSIS — Z1231 Encounter for screening mammogram for malignant neoplasm of breast: Secondary | ICD-10-CM

## 2021-10-27 ENCOUNTER — Other Ambulatory Visit: Payer: Self-pay | Admitting: Family Medicine

## 2021-10-27 DIAGNOSIS — Z1231 Encounter for screening mammogram for malignant neoplasm of breast: Secondary | ICD-10-CM

## 2021-10-28 ENCOUNTER — Ambulatory Visit
Admission: RE | Admit: 2021-10-28 | Discharge: 2021-10-28 | Disposition: A | Discharge status: Home / Self Care | Payer: No Typology Code available for payment source | Source: Ambulatory Visit | Attending: Family Medicine | Admitting: Family Medicine

## 2021-10-28 DIAGNOSIS — Z1231 Encounter for screening mammogram for malignant neoplasm of breast: Secondary | ICD-10-CM

## 2021-10-30 ENCOUNTER — Other Ambulatory Visit: Payer: Self-pay | Admitting: Family Medicine

## 2021-10-30 DIAGNOSIS — R928 Other abnormal and inconclusive findings on diagnostic imaging of breast: Secondary | ICD-10-CM

## 2021-11-06 ENCOUNTER — Ambulatory Visit: Payer: BC Managed Care – PPO

## 2021-11-06 ENCOUNTER — Ambulatory Visit
Admission: RE | Admit: 2021-11-06 | Discharge: 2021-11-06 | Disposition: A | Discharge status: Home / Self Care | Payer: No Typology Code available for payment source | Source: Ambulatory Visit | Attending: Family Medicine | Admitting: Family Medicine

## 2021-11-06 DIAGNOSIS — R928 Other abnormal and inconclusive findings on diagnostic imaging of breast: Secondary | ICD-10-CM

## 2023-01-24 IMAGING — MG MM DIGITAL SCREENING BILAT W/ TOMO AND CAD
8 series · 9 of 24 positions shown · non-contrast
Comparison: Previous exam(s).

CLINICAL DATA: Screening.

EXAM:
DIGITAL SCREENING BILATERAL MAMMOGRAM WITH TOMOSYNTHESIS AND CAD
TECHNIQUE: Bilateral screening digital craniocaudal and mediolateral oblique
mammograms were obtained. Bilateral screening digital breast
tomosynthesis was performed. The images were evaluated with
computer-aided detection.

[R MLO synth-2D]
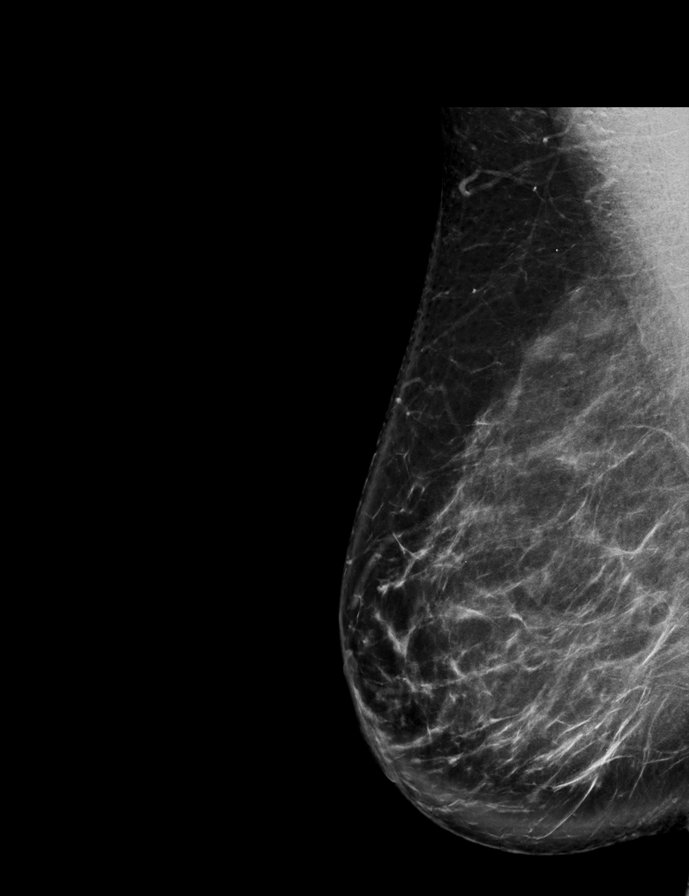

[L CC synth-2D]
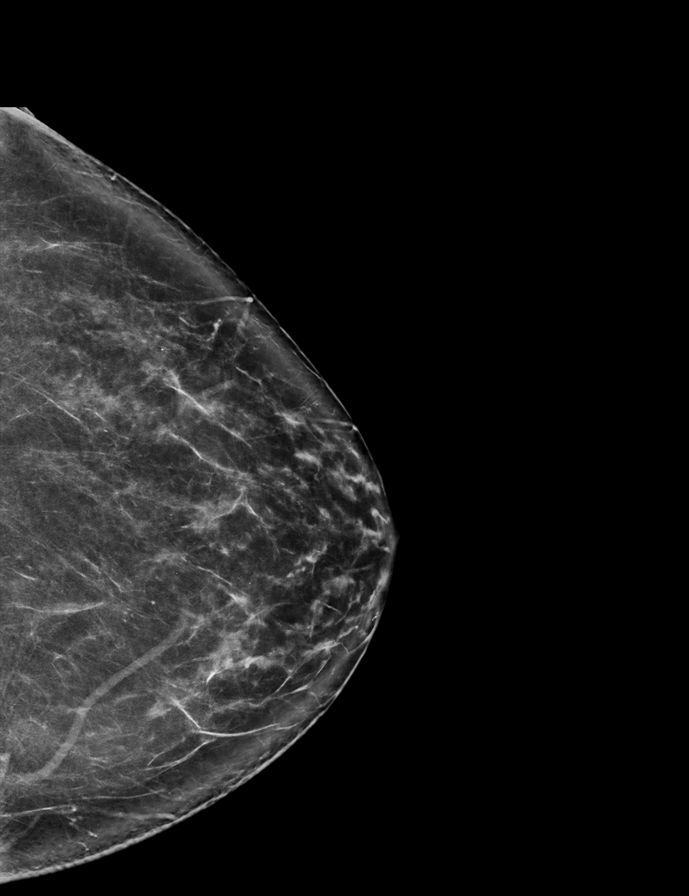

[R CC synth-2D]
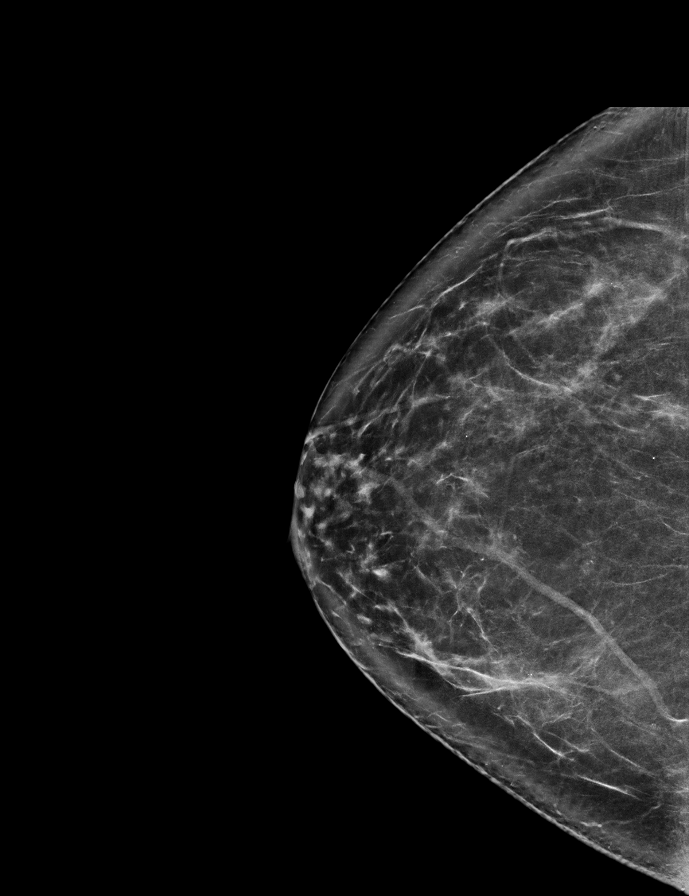

[L MLO synth-2D]
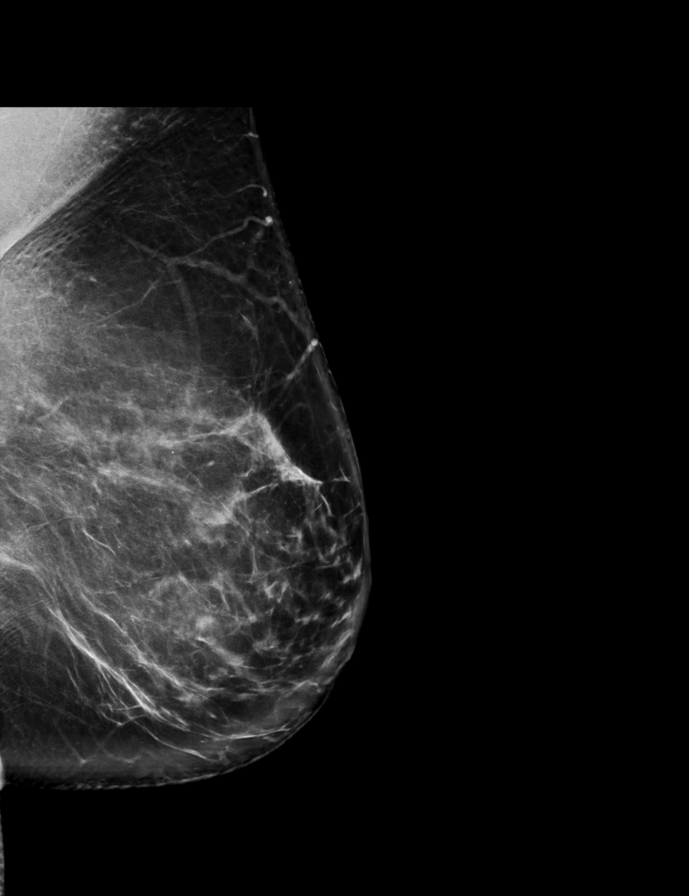

[L CC tomo · 2 of 81 frames shown]
[frame 27/81]
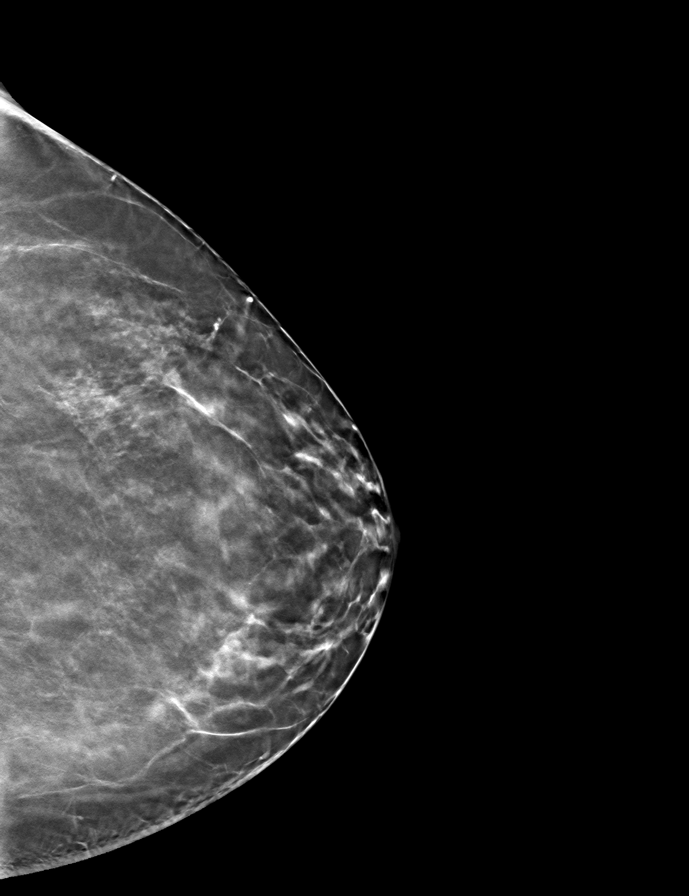
[frame 41/81]
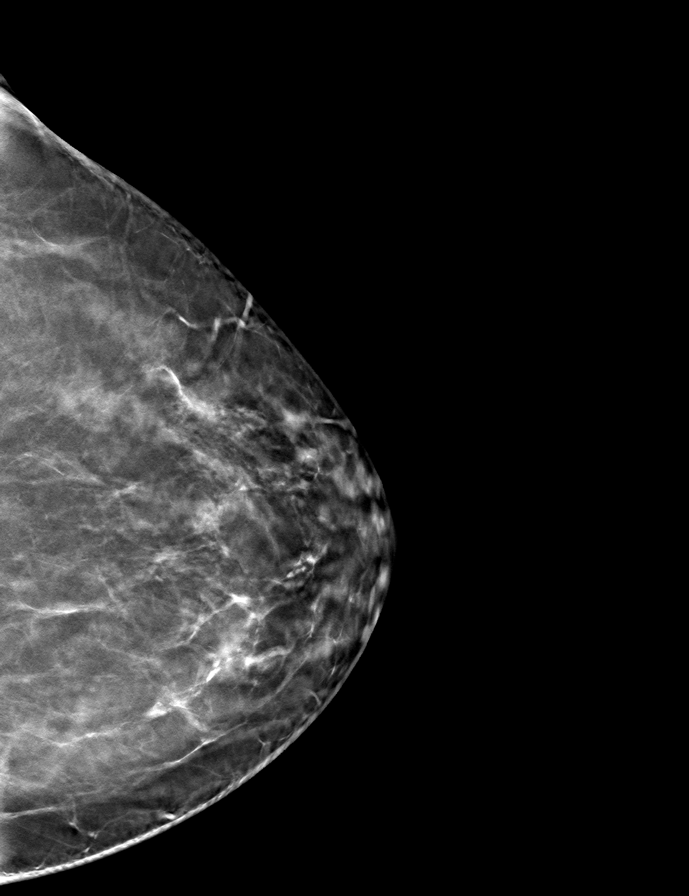

[R MLO tomo · tomo slice 47/92.0]
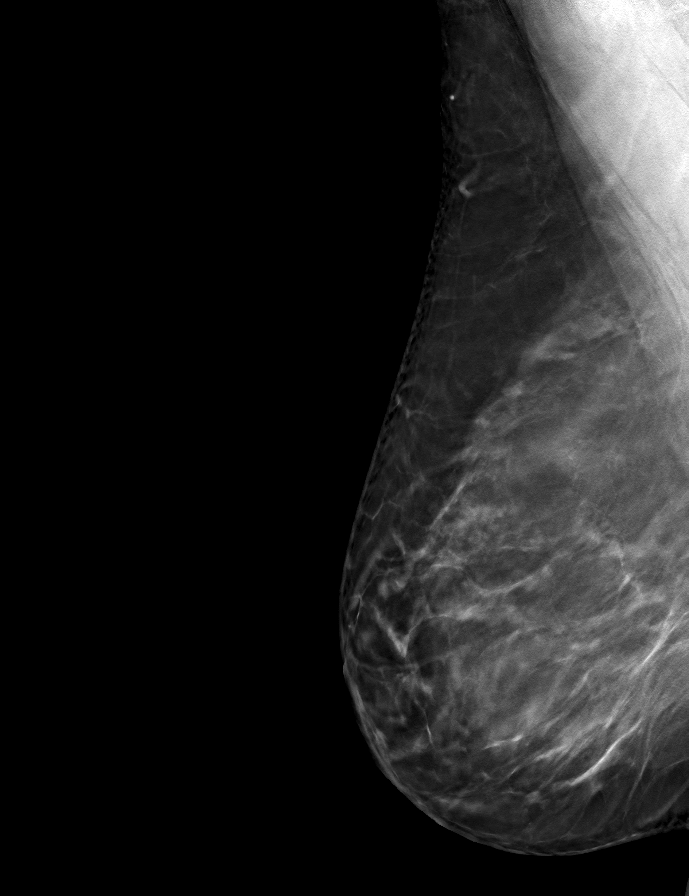

[R CC tomo · tomo slice 41/80.0]
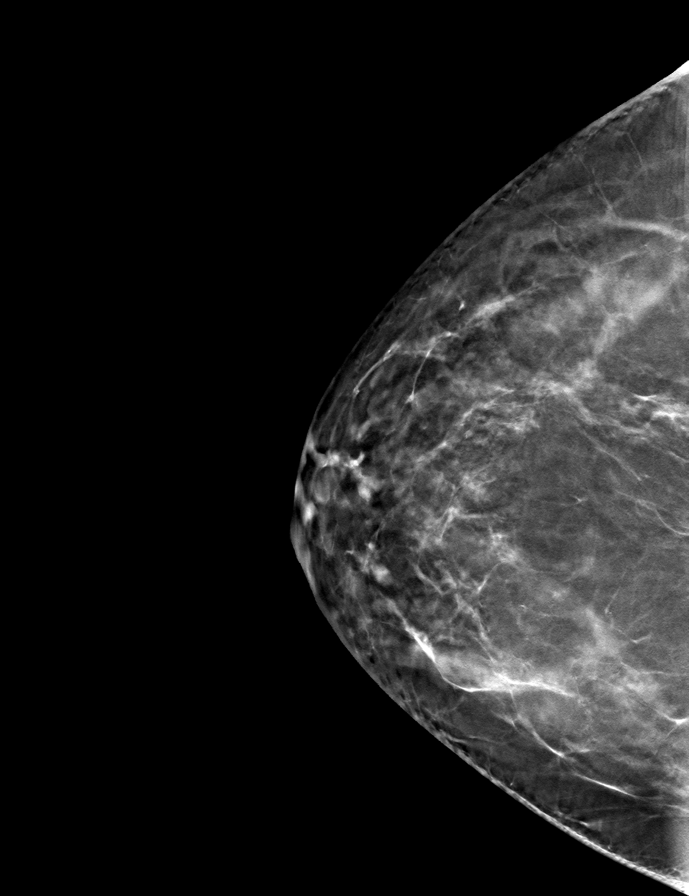

[L MLO tomo · tomo slice 48/95.0]
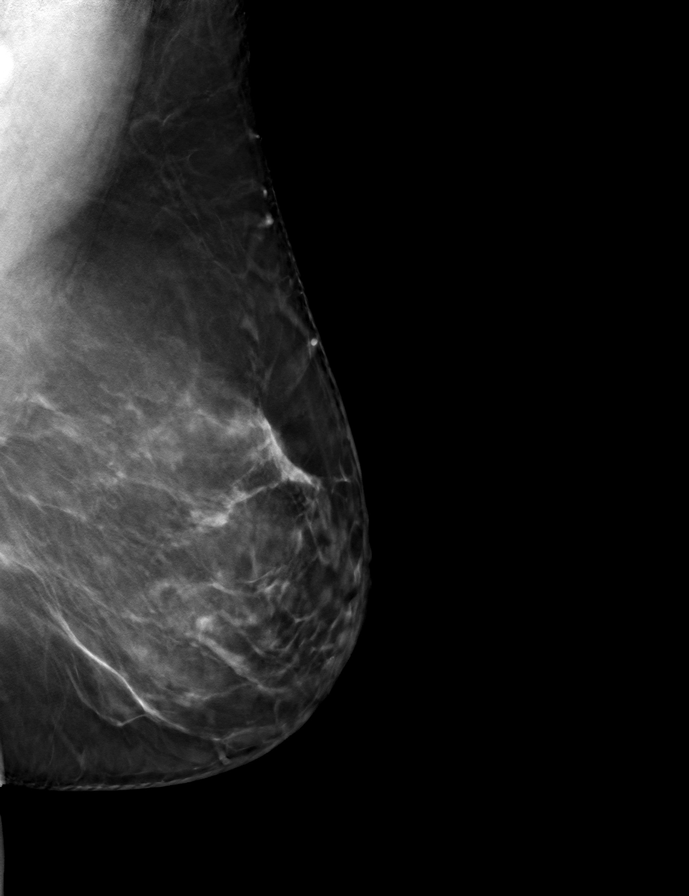

[9 of 24 positions shown; findings below may reference images not displayed]

ACR Breast Density Category c: The breast tissue is heterogeneously
dense, which may obscure small masses.
FINDINGS: In the left breast, a possible asymmetry warrants further
evaluation. In the right breast, no findings suspicious for
malignancy.
IMPRESSION: Further evaluation is suggested for possible asymmetry in the left
breast.

RECOMMENDATION:
Diagnostic mammogram and possibly ultrasound of the left breast.
(Code:[id number]-V-11X)

The patient will be contacted regarding the findings, and additional
imaging will be scheduled.

BI-RADS CATEGORY  0: Incomplete. Need additional imaging evaluation
and/or prior mammograms for comparison.
